# Patient Record
Sex: Male | Born: 1960 | Race: White | Hispanic: No | Marital: Married | Smoking: Former smoker
Health system: Southern US, Community
[De-identification: ages and names within clinical notes are randomized; demographics above are authoritative.]

## PROBLEM LIST (undated history)

## (undated) DIAGNOSIS — K259 Gastric ulcer, unspecified as acute or chronic, without hemorrhage or perforation: Secondary | ICD-10-CM

## (undated) DIAGNOSIS — K219 Gastro-esophageal reflux disease without esophagitis: Secondary | ICD-10-CM

## (undated) DIAGNOSIS — I639 Cerebral infarction, unspecified: Secondary | ICD-10-CM

## (undated) DIAGNOSIS — K859 Acute pancreatitis without necrosis or infection, unspecified: Secondary | ICD-10-CM

## (undated) DIAGNOSIS — I10 Essential (primary) hypertension: Secondary | ICD-10-CM

## (undated) DIAGNOSIS — Z87898 Personal history of other specified conditions: Secondary | ICD-10-CM

---

## 2006-09-18 ENCOUNTER — Emergency Department: Payer: Self-pay | Admitting: Emergency Medicine

## 2007-08-14 ENCOUNTER — Inpatient Hospital Stay: Payer: Self-pay | Admitting: Internal Medicine

## 2007-08-14 ENCOUNTER — Other Ambulatory Visit: Payer: Self-pay

## 2011-03-27 LAB — COMPREHENSIVE METABOLIC PANEL
Albumin: 4.1 g/dL (ref 3.4–5.0)
Alkaline Phosphatase: 58 U/L (ref 50–136)
BUN: 7 mg/dL (ref 7–18)
Bilirubin,Total: 0.3 mg/dL (ref 0.2–1.0)
Chloride: 106 mmol/L (ref 98–107)
Co2: 27 mmol/L (ref 21–32)
EGFR (Non-African Amer.): >60
Potassium: 3.6 mmol/L (ref 3.5–5.1)
SGPT (ALT): 18 U/L
Sodium: 143 mmol/L (ref 136–145)
Total Protein: 7.3 g/dL (ref 6.4–8.2)

## 2011-03-27 LAB — CBC
HCT: 42.4 % (ref 40.0–52.0)
HGB: 14.6 g/dL (ref 13.0–18.0)
MCV: 93 fL (ref 80–100)
RBC: 4.58 10*6/uL (ref 4.40–5.90)
WBC: 8.8 10*3/uL (ref 3.8–10.6)

## 2011-03-27 LAB — URINALYSIS, COMPLETE
Bacteria: NONE SEEN
Bilirubin,UR: NEGATIVE
Glucose,UR: NEGATIVE mg/dL (ref 0–75)
Ketone: NEGATIVE
RBC,UR: <1 /HPF (ref 0–5)
WBC UR: <1 /HPF (ref 0–5)

## 2011-03-27 LAB — TROPONIN I: Troponin-I: <0.02 ng/mL

## 2011-03-28 ENCOUNTER — Observation Stay: Payer: Self-pay | Admitting: Internal Medicine

## 2011-03-28 LAB — DRUG SCREEN, URINE
Amphetamines, Ur Screen: NEGATIVE (ref ?–1000)
Benzodiazepine, Ur Scrn: NEGATIVE (ref ?–200)
Cocaine Metabolite,Ur ~~LOC~~: NEGATIVE (ref ?–300)
MDMA (Ecstasy)Ur Screen: NEGATIVE (ref ?–500)
Methadone, Ur Screen: NEGATIVE (ref ?–300)
Opiate, Ur Screen: NEGATIVE (ref ?–300)
Phencyclidine (PCP) Ur S: NEGATIVE (ref ?–25)
Tricyclic, Ur Screen: NEGATIVE (ref ?–1000)

## 2011-03-28 LAB — CK TOTAL AND CKMB (NOT AT ARMC)
CK, Total: 78 U/L (ref 35–232)
CK-MB: 0.8 ng/mL (ref 0.5–3.6)

## 2011-03-28 LAB — TROPONIN I: Troponin-I: <0.02 ng/mL

## 2011-03-28 LAB — BASIC METABOLIC PANEL
Anion Gap: 10 (ref 7–16)
BUN: 8 mg/dL (ref 7–18)
Calcium, Total: 8.5 mg/dL (ref 8.5–10.1)
Chloride: 111 mmol/L — ABNORMAL HIGH (ref 98–107)
Creatinine: 0.65 mg/dL (ref 0.60–1.30)
EGFR (African American): >60
EGFR (Non-African Amer.): >60
Potassium: 3.8 mmol/L (ref 3.5–5.1)

## 2011-03-28 LAB — CBC WITH DIFFERENTIAL/PLATELET
Basophil %: 1.3 %
Eosinophil #: 0.4 10*3/uL (ref 0.0–0.7)
HCT: 39.8 % — ABNORMAL LOW (ref 40.0–52.0)
HGB: 13.6 g/dL (ref 13.0–18.0)
Lymphocyte %: 26.4 %
MCHC: 34.3 g/dL (ref 32.0–36.0)
MCV: 93 fL (ref 80–100)
Monocyte #: 0.5 10*3/uL (ref 0.0–0.7)
Monocyte %: 6.7 %
Neutrophil #: 4.7 10*3/uL (ref 1.4–6.5)
Neutrophil %: 60.5 %
RBC: 4.3 10*6/uL — ABNORMAL LOW (ref 4.40–5.90)

## 2011-03-28 LAB — ETHANOL: Ethanol %: <0.003 % (ref 0.000–0.080)

## 2011-03-28 LAB — LIPID PANEL
Cholesterol: 154 mg/dL (ref 0–200)
Cholesterol: 157 mg/dL (ref 0–200)
HDL Cholesterol: 45 mg/dL (ref 40–60)
Ldl Cholesterol, Calc: 81 mg/dL (ref 0–100)
Ldl Cholesterol, Calc: 99 mg/dL (ref 0–100)
Triglycerides: 136 mg/dL (ref 0–200)
VLDL Cholesterol, Calc: 13 mg/dL (ref 5–40)

## 2011-03-28 LAB — HEMOGLOBIN A1C: Hemoglobin A1C: 5.8 % (ref 4.2–6.3)

## 2011-03-29 LAB — BASIC METABOLIC PANEL
Calcium, Total: 8.6 mg/dL (ref 8.5–10.1)
Chloride: 112 mmol/L — ABNORMAL HIGH (ref 98–107)
Co2: 24 mmol/L (ref 21–32)
Creatinine: 0.81 mg/dL (ref 0.60–1.30)
Potassium: 3.8 mmol/L (ref 3.5–5.1)
Sodium: 145 mmol/L (ref 136–145)

## 2014-07-07 NOTE — Consult Note (Signed)
Brief Consult Note: Diagnosis: R/O alcohol dependence.   Patient was seen by consultant.   Consult note dictated.   Recommend further assessment or treatment.   Comments: Spoke with the patuient and his wife. He adamantly denies any problems with alcohol, just "2 cold beers at night" and declines substance abuse treatment program participation.  Electronic Signatures: Orson Slick (MD)  (Signed 14-Jan-13 19:17)  Authored: Brief Consult Note   Last Updated: 14-Jan-13 19:17 by Orson Slick (MD)

## 2014-07-07 NOTE — Consult Note (Signed)
PATIENT NAME:  Joel Campos, Joel Campos MR#:  [id number] DATE OF BIRTH:  02/25/1961  DATE OF CONSULTATION:  03/28/2011  REFERRING PHYSICIAN:  Dr. Posey Pronto  CONSULTING PHYSICIAN:  Isaias Cowman, MD  CHIEF COMPLAINT: Blurry vision.   HISTORY OF PRESENT ILLNESS: The patient is a 54 year old gentleman referred for evaluation of pulmonary edema. The patient apparently was in his usual state of health until 03/27/2011. He came home from work and developed blurry vision with slurred speech. The patient has a history of alcohol abuse. The patient had been abstinent for seven years and recently had been drinking approximately three beers every day. The patient presented to [facility] Emergency Room where he has ruled out for myocardial infarction by CPK isoenzymes. B-type Natriuretic Peptide was borderline elevated at 135. Chest x-ray revealed mild interstitial edema. The patient denies chest pain or shortness of breath.   PAST MEDICAL HISTORY: Alcoholism.   MEDICATIONS: None.   SOCIAL HISTORY: The patient is married. Has three children. He drinks three beers a day. He smokes 1 pack of cigarettes a day.   FAMILY HISTORY: No immediate family history of myocardial infarction or coronary artery disease.   REVIEW OF SYSTEMS: CONSTITUTIONAL: No fever or chills. EYES: The patient has blurry vision. EARS: The patient denies hearing loss. RESPIRATORY: The patient denies shortness of breath. CARDIOVASCULAR: The patient denies chest pain. GI: The patient denies nausea, vomiting, diarrhea, or constipation. GU: The patient denies dysuria or hematuria. NEUROLOGIC: The patient denies any focal muscle weakness or numbness. PSYCHOLOGICAL: The patient denies anxiety or depression.   PHYSICAL EXAMINATION:   VITAL SIGNS: Blood pressure 110/65, pulse 54, respirations 17, pulse oximetry 97%, temperature 98.7.   HEENT: The patient has an eye patch over his right eye.   LUNGS: Clear.   HEART: Normal JVP. Normal PMI. Regular rate  and rhythm. Normal S1, S2. No appreciable gallop, murmur, or rub.   ABDOMEN: Soft, nontender. Pulses were intact bilaterally.   MUSCULOSKELETAL: Normal muscle tone.   NEUROLOGIC: The patient is alert and oriented x3. Motor and sensory both grossly intact.   IMPRESSION: This is a 54 year old gentleman who presents with blurry vision and confusion without chest pain or shortness of breath. Chest x-ray revealed mild interstitial edema. B-type Natriuretic Peptide was only borderline elevated. The patient had no clinical signs of ischemia or congestive heart failure.  RECOMMENDATIONS:  1. Continue present medications.  2. Would defer full dose anticoagulation.  3. Defer diuresis in the absence of symptoms and clinical signs of CHF.  4. Review 2-D echocardiogram.  5. Further recommendations pending echocardiogram results.  ____________________________ Isaias Cowman, MD ap:drc D: 03/28/2011 09:39:15 ET T: 03/28/2011 11:02:27 ET JOB#: [id number]  cc: Isaias Cowman, MD, <Dictator> Isaias Cowman MD ELECTRONICALLY SIGNED 04/08/2011 12:37

## 2014-07-07 NOTE — Consult Note (Signed)
PATIENT NAME:  Joel Campos, Joel Campos MR#:  [id number] DATE OF BIRTH:  02-22-1961  DATE OF CONSULTATION:  03/29/2011  REFERRING PHYSICIAN:  Lenore Cordia, MD CONSULTING PHYSICIAN:  Jolanta B. Bary Leriche, MD  IDENTIFYING DATA: Joel Campos is a 54 year old male with history of alcohol use.   CHIEF COMPLAINT:  I have been sober.   HISTORY OF PRESENT ILLNESS: Joel Campos was admitted to [facility] medical floor for confusion. He reportedly suffered a stroke in the right lobe of the thalamus. This was initially associated with confusion, difficulties walking and problems with his vision. All symptoms resolved, but he still feels that his vision is slightly blurry. This worries him as he gainfully employed and feels that vision problems may affect his job performance. He was assured by his medical doctor that this should go away. The patient has a history of alcoholism per chart, but he and his wife adamantly deny any current alcohol use and are not interested in substance abuse treatment. The patient assures me that he has been working seven days a week and cannot afford to drink. He does have one or two cold beers after work, but feels that this is pretty normal. He denies symptoms of depression, anxiety, symptoms suggestive of bipolar mania or psychotic symptoms. He denies illicit substance or prescription pill abuse.   PAST PSYCHIATRIC HISTORY: He denies ever being hospitalized. No suicide attempts. No substance abuse treatments. Per chart, he does have a history of alcoholism. He had been clean for seven years and three years ago relapsed on alcohol.   FAMILY PSYCHIATRIC HISTORY: None reported.   PAST MEDICAL HISTORY:  1. Recent cerebrovascular accident.  2. Malignant hypertension.  3. Hypernatremia.  4. Tobacco abuse.   ALLERGIES: No known drug allergies.   MEDICATIONS ON ADMISSION:  1. Aspirin 325 mg daily.  2. Atorvastatin 40 mg daily.  3. Nicotine inhaler.   REVIEW OF  SYSTEMS: CONSTITUTIONAL: No fevers or chills. No weight changes. EYES: Positive for some blurred vision since his stroke. ENT: No hearing loss. RESPIRATORY: No shortness of breath or cough. CARDIOVASCULAR: No chest pain or orthopnea. GASTROINTESTINAL: No abdominal pain, nausea, vomiting, or diarrhea. History of gastrointestinal bleed. GU: No incontinence or frequency. ENDOCRINE: No heat or cold intolerance. LYMPHATIC: No anemia or easy bruising. INTEGUMENTARY: No acne or rash. MUSCULOSKELETAL: No muscle or joint pain. NEUROLOGIC: No tingling or weakness. PSYCHIATRIC: See history of present illness for details.   PHYSICAL EXAMINATION:  VITAL SIGNS: Blood pressure 127/69, pulse 76, respirations 20, temperature 97.5.   GENERAL: This is a well-developed male in no acute distress. The rest of the physical examination is deferred to his primary provider. Normal muscle strength. No stiffness or cogwheeling. No tremor.   LABORATORY DATA: Chemistries within normal limits except for blood glucose of 122. Blood alcohol level on admission 0. LFTs within normal limits. Urine tox screen negative for substances. CBC within normal limits. Urinalysis is not suggestive of urinary tract infection. EKG: Normal sinus rhythm, possible left atrial enlargement, incomplete right bundle branch block.   MENTAL STATUS EXAMINATION: The patient is alert and oriented to person, place, time, and situation. He is sitting comfortably in bed having lunch that his wife brought for him, enjoying good appetite. The wife is having breathing treatment in his room. The patient is pleasant, polite, and cooperative. He maintains good eye contact. He is well groomed and casually dressed, ready to go home. His speech is of normal rhythm, rate, and volume. Mood is good with  full affect. Thought processing is logical and goal oriented. Thought content: He denies suicidal or homicidal ideation. There are no delusions or paranoia. There are no auditory or  visual hallucinations. His cognition is grossly intact. His insight and judgment are fair. His wife who is present during the interview confirms his story. They have been married for a long time. She does not see any problems with the extent of his drinking.   SUICIDE RISK ASSESSMENT: This is a patient with history of alcoholism who relapsed on alcohol three years ago after a seven year period of sobriety who at bedtime been consuming two beers each night and who declines substance abuse treatment.   DIAGNOSIS: AXIS I: Alcohol dependence.   AXIS II: Deferred.   AXIS III:  1. Recent cerebrovascular accident.  2. Hypertension.   AXIS IV: Substance abuse, physical illness.   AXIS V: GAF 45.   PLAN: The patient adamantly denies any problems with alcohol and is not willing to participate in substance abuse treatment. The wife concurs. I will sign off.    ____________________________ Wardell Honour. Bary Leriche, MD jbp:ap D: 03/29/2011 19:38:28 ET T: 03/30/2011 10:52:41 ET JOB#: [id number]  cc: Jolanta B. Bary Leriche, MD, <Dictator> Joel Fredrickson MD ELECTRONICALLY SIGNED 04/06/2011 23:32

## 2014-07-07 NOTE — Consult Note (Signed)
Brief Consult Note: Diagnosis: ? CHF, mild edema on CXR, borderline elevated BNP, no clinical evidence of CHF, neg trop.   Patient was seen by consultant.   Consult note dictated.   Comments: REC  Agree with current therpay, defer diuresis, defer full dose anticoagulation, review echo, further recommendations pending echo results.  Electronic Signatures: Isaias Cowman (MD)  (Signed 13-Jan-13 09:41)  Authored: Brief Consult Note   Last Updated: 13-Jan-13 09:41 by Isaias Cowman (MD)

## 2014-07-07 NOTE — H&P (Signed)
PATIENT NAME:  Joel Campos, Joel Campos MR#:  [id number] DATE OF BIRTH:  [birth year]  DATE OF ADMISSION:  03/27/2011  PRIMARY CARE PHYSICIAN: None.  EMERGENCY ROOM PHYSICIAN: Dr. Renee Ramus  CHIEF COMPLAINT: Confusion.   HISTORY OF PRESENT ILLNESS: The patient is a 54 year old male who presents with the chief complaint of confusion. Symptoms began about 4 o'clock p.m. this evening. The patient came home from work and appeared to be not himself. His speech was somewhat slurred. He could not make any eye contact. The patient has history of alcohol abuse. He reports that he drinks about three beers every day. He had stayed abstinent for about 7 years. For the last 3 years he has had a relapse and has been drinking alcohol. Last evening around 3:30 he had one beer.  He reports having blurred vision. The patient has a history of gastrointestinal bleeding and underwent EGD in June 2009 which showed gastric ulcer duodenitis.   PAST MEDICAL HISTORY: Alcoholism.   ALLERGIES: No known drug allergies.   SOCIAL HISTORY: The patient smokes one pack per day. He has drank three beers a day for the last three years. He has history of alcoholism and had quit drinking alcohol seven years ago. For the last three years he has had a relapse. He denies any IV drug abuse. He is employed in a [employer], chart[employer].   FAMILY HISTORY: The patient's father died in his 29s and had lung cancer. Mother is 25 years old and has a pacemaker and heart disease.   REVIEW OF SYSTEMS: CONSTITUTIONAL: The patient denies any fevers, chills, or night sweats. HEENT: The patient denies any hearing loss, dysphagia, visual problems, or sore throat. CARDIOVASCULAR: The patient denies any chest pain, orthopnea, or PND. RESPIRATORY: The patient denies any cough, wheezing, or hemoptysis. GI: The patient denies any abdominal pain, hematemesis, hematochezia, or melena. GU: The patient denies any hematuria, dysuria, or frequency. NEUROLOGIC: The patient denies any  headache, focal weakness, or seizures. SKIN: The patient denies any lesions or rash. ENDOCRINE: The patient denies polyuria, polyphagia, or polydipsia. MUSCULOSKELETAL: The patient denies any arthritis, joint effusion, or swelling. HEMATOLOGICAL: The patient denies any easy bleeding or bruises.   PHYSICAL EXAMINATION:   VITAL SIGNS: Temperature 97.2, heart rate 90, respiratory rate 18, blood pressure 175/97, and oxygen saturation 98%.   HEENT: Atraumatic, normocephalic. Pupils are equal, round, and reactive to light and accommodation. Extraocular movements intact. Sclerae anicteric. Mucous membranes dry.   NECK: Supple. No organomegaly.   HEART: S1 and S2 regular rate and rhythm. No gallops or rubs. No thrills. No murmurs.   LUNGS: Clear to auscultation. No rales, no rhonchi, no wheezes, and no bronchial breath sounds.   GI: Abdomen is soft, nontender, and nondistended. Normal bowel sounds. No hepatosplenomegaly.   GU: There is no hematuria or masses noted.   SKIN: No lesions and no rash. No ecchymosis, no bleeding, and no petechiae noted.   LYMPH: No lymphadenopathy or nodes palpable.   NEUROLOGIC: Cranial nerves II through XII grossly intact. Motor strength is 5 out of 5 in bilateral upper and lower extremities. Sensation is within normal limits. No focal neurological deficits are noted on examination.   MUSCULOSKELETAL: No arthritis, joint effusion, or swelling.   EXTREMITIES: No cyanosis, no clubbing, and no edema. 2+ pedal pulses are noted bilaterally.   LABS/STUDIES: EKG: Normal sinus rhythm at 94 beats per minute, incomplete right bundle branch block.  CT scan of the brain: No evidence of acute intracranial abnormalities.   Chest  x-ray shows there is mild hyperinflation and pulmonary venous hypertension with the findings suggesting mild interstitial edema.   Glucose 101, BUN 7, creatinine 0.65, sodium 143, potassium 3.6, chloride 96, CO2 27, calcium 8.8, total bilirubin 0.3,  alkaline phosphatase 58, ALT 18, AST 26, total protein 7.3, and albumin 4.1. EGFR greater than 60. WBC count 8000, hemoglobin 14.6, hematocrit 42.4, and platelet count 269. Troponin is less than 0.02.   Urinalysis is negative.   ASSESSMENT AND PLAN:  1. The patient is a 54 year old male with history of alcoholism who presents with the chief complaint of confusion, blurred vision, and hypertension, alcohol withdrawal syndrome. Admit to Critical Care Unit. Start protocol CIWA protocol with Ativan, thiamine, folate acid, and clonidine for hypertension. Start IV fluids. Substance abuse counseling. Start IV Protonix.  2. Mild interstitial pulmonary edema. Etiology is unclear. We will get a CT scan of the chest. We will check serial cardiac enzymes and troponin, and check BNP.  ____________________________ Tyrone Schimke, MD jsp:slb D: 03/27/2011 23:27:36 ET T: 03/28/2011 08:10:47 ET JOB#: [id number]  cc: Tyrone Schimke, MD, <Dictator> Tyrone Schimke MD ELECTRONICALLY SIGNED 03/28/2011 22:08

## 2014-07-07 NOTE — H&P (Signed)
PATIENT NAME:  Joel Campos, Joel Campos MR#:  [id number] DATE OF BIRTH:  [birth year]  DATE OF ADMISSION:  03/27/2011  ADDENDUM  ASSESSMENT AND PLAN:  1. Altered mental status: We will check carotid Doppler and MRI of the brain. 2. Pulmonary edema: Findings on chest x-ray. The patient is not having any symptoms. We will get an Echo and Cardiology consultation.  ____________________________ Tyrone Schimke, MD jsp:slb D: 03/28/2011 00:26:43 ET T: 03/28/2011 08:46:25 ET JOB#: [id number]  cc: Tyrone Schimke, MD, <Dictator> Tyrone Schimke MD ELECTRONICALLY SIGNED 03/28/2011 22:08

## 2014-07-07 NOTE — Discharge Summary (Signed)
PATIENT NAME:  Joel Campos, Joel Campos MR#:  [id number] DATE OF BIRTH:  [birth year]  DATE OF ADMISSION:  03/27/2011 DATE OF DISCHARGE:  03/29/2011  ADMITTING DIAGNOSIS: Stroke.  DISCHARGE DIAGNOSES: 1. 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  2. Malignant hypertension.  3. Alcohol, tobacco abuse. 4. Hyponatremia, resolved with intravenous fluids.  5. Hyperglycemia with A1c 5.8.   DISCHARGE CONDITION: Fair.   DISCHARGE MEDICATIONS: Patient does not have any home medications. Patient is to start new medications which are:  1. Aspirin 325 mg p.o. daily.  2. Lipitor 40 mg p.o. at bedtime.  3. Nicotine oral inhaler one cartridge every one hour as needed.   DIET: 2 grams salt, low fat, low cholesterol.   PHYSICAL ACTIVITY LIMITATIONS: As tolerated.   DISCHARGE INSTRUCTIONS: The patient was advised to patch his eyes alternating daily; one day he would patch one eye, other day he would patch other eye. Patient was advised not to smoke as well as not to drink any alcohol.   FOLLOW UP: He is to follow up and establish primary care physician at [facility] in two days after discharge. He is also to follow up with ophthalmologist in next few days after discharge. He is also to follow up with Dr. Lovie Macadamia on 04/03/2011 at 10:00 a.m.   CONSULTANTS:  1. Care management.  2. Dr. Saralyn Pilar.   LABORATORY, DIAGNOSTIC AND RADIOLOGICAL DATA: Chest x-ray one view 03/27/2011: Pulmonary venous hypertension with findings suggesting mild interstitial edema, mild hyperinflation. CT of head without contrast 03/26/2010: No acute intracranial process. CT of chest for pulmonary embolism with contrast 03/28/2011 showed no pulmonary embolus identified. Indeterminate 4 mm pulmonary nodules. A follow-up noncontrast chest CT in twelve months is recommended. MRI of brain without contrast 03/28/2011 showed acute nonhemorrhagic infarct in right lobe of thalamus. MRI of  cervical spine without contrast 03/28/2011 showed spinal cord which is normal in caliber and signal, however, no significant bulges or neuroforaminal narrowing was noted. Echocardiogram 03/28/2011: Normal left ventricular function, left ventricular hypertrophy, mild to moderate MR, mild TR. Carotid ultrasound Doppler 03/28/2011 showed no evidence of the hemodynamically significant stenosis in extracranial carotid arteries.   Patient's EKG showed normal sinus rhythm at 94 beats per minute, normal axis, incomplete right bundle branch block was noted, nonspecific ST-T changes. Patient's lab data 03/27/2011: Glucose elevated at 101, otherwise unremarkable BMP. Patient's liver enzymes were normal. Cardiac enzymes, first set, as well as subsequent one set was unremarkable. Patient's urine drug screen was also unremarkable. CBC was within normal limits. Urinalysis was unremarkable. ABGs were done on 21% FiO2 showed pH 7.43, pCO2 42, pO2 99, saturation 97.9%. Chest x-ray was unremarkable as well as his CT scan of head.   HISTORY OF PRESENT ILLNESS: Patient is a 54 year old Caucasian male with history of tobacco, alcohol abuse, not on any medications at home who presented to the hospital with complaints of sudden onset of double vision as well as unsteadiness on his feet, bumping into furniture. Please refer to Dr. Serita Grit admission note on 03/27/2011. On arrival to the hospital patient's temperature was 97.2, heart rate was 90, respiration rate was 18, blood pressure 175/97, saturation was 98% on room air. Physical examination was unremarkable.   HOSPITAL COURSE: Patient presented to the hospital with concerns about possible alcohol withdrawal due to his confusion as well as staggering gait, however, it appeared that patient has also double vision. Stroke workup was entertained. Patient was started on aspirin therapy and continued thereof. He  had MRI of his brain done which revealed stroke in right thalamus area. He  also had carotid ultrasound which was unremarkable for abnormalities. It was felt that patient's thalamic stroke could have been thrombotic, however, echocardiogram done on 03/28/2011 failed to show any significant abnormalities. It was felt that patient is to continue aspirin therapy as well as Lipitor. Patient's lipid panel was performed while he was in the hospital and LDL was found to be 81 to 99 and total cholesterol was found to be 154 to 157, triglycerides 136 on nonfasting specimen and 66 on fasting specimen, and HDL was found to be 46 to 45. It was felt that patient is to continue Lipitor. He is to have low fat, low cholesterol diet and follow up with his primary care physician to make sure that patient's lipid panel, LDL remains below 100, preferably below 70. Patient was evaluated by speech therapist as well as physical therapist who did not feel that he needs any further follow up. He improved significantly in regards to his gait, however, intermittently required some help. He is to get cane if he needs to from [place] for ambulation. He is also to follow up with ophthalmologist for help in regards to his double vision management. For now he was recommended to continue patching his eye intermittently, one eye or the other eye to help him with double vision.   In regards to malignant hypertension, patient's blood pressure was noted to be significantly elevated initially on arrival to the hospital, however, when patient's acute stroke resolved patient's blood pressure was found to be normal around 765 to 465K systolic. As patient's blood pressure remained somewhat on the lower side, 120s to 130s, it was felt that it would be concerning to start blood pressure medications in the hospital because of concern of lowering his blood pressure significantly and worsening his ischemia, however, if patient's blood pressure readings remain high as outpatient patient should be started on blood pressure medications  if needed.   In regards to alcohol abuse, patient was counseled extensively about alcohol use, however, he was placed on CIWA scale and did not show any withdrawal symptoms.   In regards to tobacco abuse, he was again counseled. He was advised not to smoke because of risks of worsening stroke. He understood the warnings and agreed to continue nicotine replacement therapy.   Patient was noted to be hyperglycemic in the hospital. A few tests were done early in the morning and patient's blood glucose levels remained high. It was unclear, however, it was fasting testing or not, however, patient's elevated blood glucose levels warranted further evaluation so hemoglobin A1c was performed. Hemoglobin A1c was found to be not elevated at 5.8, however, it is recommended to follow patient's blood glucose levels and get formal glucose tolerance test as outpatient if needed.   Patient is being discharged in stable condition with above-mentioned medications and follow up. His vital signs on the day of discharge: Temperature 98.0, pulse 68, respiration rate 20, blood pressure systolic range of 354S to 568, diastolic ranging from 68 to 83, oxygen saturation 97% on room air at rest.     TIME SPENT: 40 minutes.  ____________________________ Theodoro Grist, MD rv:cms D: 03/29/2011 18:00:32 ET T: 03/31/2011 06:58:31 ET JOB#: [id number]  cc: Theodoro Grist, MD, <Dictator> Berneice Heinrich, MD [place] MD ELECTRONICALLY SIGNED 04/12/2011 7:48

## 2016-11-08 ENCOUNTER — Encounter: Payer: Self-pay | Admitting: Medical Oncology

## 2016-11-08 ENCOUNTER — Emergency Department
Admission: EM | Admit: 2016-11-08 | Discharge: 2016-11-08 | Disposition: A | Discharge status: Home / Self Care | Payer: BLUE CROSS/BLUE SHIELD | Attending: Emergency Medicine | Admitting: Emergency Medicine

## 2016-11-08 ENCOUNTER — Emergency Department: Payer: BLUE CROSS/BLUE SHIELD

## 2016-11-08 DIAGNOSIS — R112 Nausea with vomiting, unspecified: Secondary | ICD-10-CM | POA: Insufficient documentation

## 2016-11-08 LAB — CBC
HCT: 50.5 % (ref 40.0–52.0)
Hemoglobin: 17.6 g/dL (ref 13.0–18.0)
MCH: 31.6 pg (ref 26.0–34.0)
MCHC: 34.7 g/dL (ref 32.0–36.0)
MCV: 91 fL (ref 80.0–100.0)
PLATELETS: 286 10*3/uL (ref 150–440)
RBC: 5.55 MIL/uL (ref 4.40–5.90)
RDW: 13.3 % (ref 11.5–14.5)
WBC: 10.8 10*3/uL — ABNORMAL HIGH (ref 3.8–10.6)

## 2016-11-08 LAB — COMPREHENSIVE METABOLIC PANEL
ALBUMIN: 4.9 g/dL (ref 3.5–5.0)
ALT: 13 U/L — ABNORMAL LOW (ref 17–63)
ANION GAP: 11 (ref 5–15)
AST: 19 U/L (ref 15–41)
Alkaline Phosphatase: 78 U/L (ref 38–126)
BILIRUBIN TOTAL: 1.4 mg/dL — AB (ref 0.3–1.2)
BUN: 23 mg/dL — ABNORMAL HIGH (ref 6–20)
CHLORIDE: 108 mmol/L (ref 101–111)
CO2: 26 mmol/L (ref 22–32)
Calcium: 10.1 mg/dL (ref 8.9–10.3)
Creatinine, Ser: 0.79 mg/dL (ref 0.61–1.24)
GFR calc Af Amer: >60 mL/min (ref >60)
GFR calc non Af Amer: >60 mL/min (ref >60)
GLUCOSE: 97 mg/dL (ref 65–99)
POTASSIUM: 3.9 mmol/L (ref 3.5–5.1)
SODIUM: 145 mmol/L (ref 135–145)
TOTAL PROTEIN: 8.1 g/dL (ref 6.5–8.1)

## 2016-11-08 LAB — TROPONIN I

## 2016-11-08 LAB — LIPASE, BLOOD: Lipase: 22 U/L (ref 11–51)

## 2016-11-08 MED ORDER — ONDANSETRON 4 MG PO TBDP: 4.0000 mg | ORAL_TABLET | Freq: Three times a day (TID) | ORAL | 0 refills | Status: AC | PRN

## 2016-11-08 MED ORDER — PANTOPRAZOLE SODIUM 40 MG PO TBEC
40.0000 mg | DELAYED_RELEASE_TABLET | Freq: Every day | ORAL | 1 refills | Status: AC
Start: 2016-11-08 — End: 2019-02-21

## 2016-11-08 MED ORDER — GI COCKTAIL ~~LOC~~
30.0000 mL | Freq: Once | ORAL | Status: AC
  Administered 2016-11-08: 30 mL via ORAL
  Filled 2016-11-08: qty 30

## 2016-11-08 NOTE — ED Notes (Signed)
Pt states he is unable to void at this time.

## 2016-11-08 NOTE — ED Triage Notes (Signed)
Pt reports that he began Saturday having nausea and vomiting clear emesis. Pt drinking coke on way to triage room, advised to stay NPO at this time. Pt reports burning pain to throat from vomiting. Pt in NAD at this time.

## 2016-11-08 NOTE — ED Notes (Addendum)
Pt reports that he has had vomiting/diarrhea since Saturday - vomited x5+ in 24 hours - diarrhea x2 in 24 hours - pt states that he is unable to keep anything down - denies any other symptoms - pt states he took phenergan but was unable to keep it down

## 2016-11-08 NOTE — ED Notes (Signed)
Patient transported to X-ray 

## 2016-11-08 NOTE — ED Provider Notes (Signed)
[facility] Emergency Department Provider Note  Time seen: 1:40 PM  I have reviewed the triage vital signs and the nursing notes.   HISTORY  Chief Complaint Emesis    HPI Joel Campos is a 56 y.o. male with no past medical history who presents to the emergency department for nausea with episodes of vomiting. According to the patient for the past 3 days he has been intermittently nauseated and occasionally vomiting clear frothy emesis. Patient states he has not been able to keep down any food for the past 3 days. Patient also is describing a burning sensation from his upper abdomen into his throat which she relates to gastric reflux. States he was prescribed Nexium at one point but does not take it. Patient denies any shortness of breath. Denies any pain in his abdomen, denies diarrhea or dysuria. Daughter states the patient has lost approximate 5 pounds as he has not been eating over the past several days. Patient does admit to frequent alcohol use but has not taken alcohol over the past 3 days. Currently the patient appears well, calm, no distress  History reviewed. No pertinent past medical history.  There are no active problems to display for this patient.   History reviewed. No pertinent surgical history.  Prior to Admission medications   Not on File    No Known Allergies  No family history on file.  Social History Social History  Substance Use Topics  . Smoking status: Not on file  . Smokeless tobacco: Not on file  . Alcohol use Not on file    Review of Systems Constitutional: Negative for fever Cardiovascular: Burning sensation in his chest/throat, which the patient believes is reflux Respiratory: Negative for shortness of breath. Gastrointestinal: Negative for abdominal pain. Positive for intermittent vomiting. Negative for diarrhea Genitourinary: Negative for dysuria Neurological: Negative for headache All other ROS  negative  ____________________________________________   PHYSICAL EXAM:  VITAL SIGNS: ED Triage Vitals [11/08/16 0928]  Enc Vitals Group     BP (!) 147/93     Pulse Rate 63     Resp 16     Temp 98.9 F (37.2 C)     Temp Source Oral     SpO2 99 %     Weight 131 lb (59.4 kg)     Height 5\' 7"  (1.702 m)     Head Circumference      Peak Flow      Pain Score 3     Pain Loc      Pain Edu?      Excl. in [place]?     Constitutional: Alert and oriented. Well appearing and in no distress. Eyes: Normal exam ENT   Head: Normocephalic and atraumatic.   Mouth/Throat: Mucous membranes are moist. Cardiovascular: Normal rate, regular rhythm. No murmur Respiratory: Normal respiratory effort without tachypnea nor retractions. Breath sounds are clear Gastrointestinal: Soft and nontender. No distention.   Musculoskeletal: Nontender with normal range of motion in all extremities. No lower extremity tenderness or edema. Neurologic:  Normal speech and language. No gross focal neurologic deficits  Skin:  Skin is warm, dry and intact.  Psychiatric: Mood and affect are normal.   ____________________________________________    EKG  EKG reviewed and interpreted by myself shows sinus rhythm at 64 bpm, narrow QRS, normal axis, normal intervals, nonspecific ST changes but no ST elevation.  ____________________________________________    RADIOLOGY  X-ray negative  ____________________________________________   INITIAL IMPRESSION / ASSESSMENT AND PLAN / ED  COURSE  Pertinent labs & imaging results that were available during my care of the patient were reviewed by me and considered in my medical decision making (see chart for details).  Patient presents to the emergency department for intermittent nausea and vomiting over the past 3 days. Overall the patient appears very well, nontender abdomen. Patient's symptoms are somewhat suggestive of esophagitis or gastritis. Patient does admit to  near daily alcohol use which would predispose him to gastritis. We will treat with a GI cocktail. Patient's basic labs are within normal limits including LFTs and lipase. I have added on a troponin as a precaution we will obtain a chest x-ray. After GI cocktail will attempt oral hydration. Patient agreeable to plan.  Chest x-ray is negative, labs are normal including cardiac enzymes. LFTs and lipase are normal as well. Patient states he is feeling much better after GI cocktail. We will attempt to orally hydrate. If the patient can drink and continues to feel well we will discharge home with GI follow-up and Protonix. Patient is agreeable to this plan. He states he has a GI doctor through [facility] that he wishes to see.  ____________________________________________   FINAL CLINICAL IMPRESSION(S) / ED DIAGNOSES  Nausea vomiting    Harvest Dark, MD 11/08/16 [phone]

## 2016-11-08 NOTE — ED Notes (Signed)
Pt tolerated PO challenge without vomiting - he stated that he felt the need to "spit" but was no longer nauseated - pt has not vomited since being brought to the back of the ER for treatment

## 2016-11-08 NOTE — ED Notes (Signed)
Pt given water for PO challenge per Dr Kerman Passey

## 2017-08-25 ENCOUNTER — Encounter: Payer: Self-pay | Admitting: *Deleted

## 2017-08-26 ENCOUNTER — Encounter
Admission: RE | Disposition: A | Discharge status: Home / Self Care | Payer: Self-pay | Source: Ambulatory Visit | Attending: Unknown Physician Specialty

## 2017-08-26 ENCOUNTER — Ambulatory Visit: Payer: BLUE CROSS/BLUE SHIELD | Admitting: Anesthesiology

## 2017-08-26 ENCOUNTER — Ambulatory Visit
Admission: RE | Admit: 2017-08-26 | Discharge: 2017-08-26 | Disposition: A | Discharge status: Home / Self Care | Payer: BLUE CROSS/BLUE SHIELD | Source: Ambulatory Visit | Attending: Unknown Physician Specialty | Admitting: Unknown Physician Specialty

## 2017-08-26 ENCOUNTER — Encounter: Payer: Self-pay | Admitting: *Deleted

## 2017-08-26 DIAGNOSIS — Z1211 Encounter for screening for malignant neoplasm of colon: Secondary | ICD-10-CM | POA: Diagnosis present

## 2017-08-26 DIAGNOSIS — Z8719 Personal history of other diseases of the digestive system: Secondary | ICD-10-CM | POA: Diagnosis not present

## 2017-08-26 DIAGNOSIS — B9681 Helicobacter pylori [H. pylori] as the cause of diseases classified elsewhere: Secondary | ICD-10-CM | POA: Diagnosis not present

## 2017-08-26 DIAGNOSIS — Z87891 Personal history of nicotine dependence: Secondary | ICD-10-CM | POA: Insufficient documentation

## 2017-08-26 DIAGNOSIS — R918 Other nonspecific abnormal finding of lung field: Secondary | ICD-10-CM | POA: Diagnosis not present

## 2017-08-26 DIAGNOSIS — K2950 Unspecified chronic gastritis without bleeding: Secondary | ICD-10-CM | POA: Insufficient documentation

## 2017-08-26 DIAGNOSIS — K621 Rectal polyp: Secondary | ICD-10-CM | POA: Insufficient documentation

## 2017-08-26 DIAGNOSIS — K219 Gastro-esophageal reflux disease without esophagitis: Secondary | ICD-10-CM | POA: Diagnosis not present

## 2017-08-26 DIAGNOSIS — Z8673 Personal history of transient ischemic attack (TIA), and cerebral infarction without residual deficits: Secondary | ICD-10-CM | POA: Insufficient documentation

## 2017-08-26 DIAGNOSIS — Z7982 Long term (current) use of aspirin: Secondary | ICD-10-CM | POA: Insufficient documentation

## 2017-08-26 DIAGNOSIS — I10 Essential (primary) hypertension: Secondary | ICD-10-CM | POA: Diagnosis not present

## 2017-08-26 DIAGNOSIS — K635 Polyp of colon: Secondary | ICD-10-CM | POA: Insufficient documentation

## 2017-08-26 DIAGNOSIS — K640 First degree hemorrhoids: Secondary | ICD-10-CM | POA: Insufficient documentation

## 2017-08-26 DIAGNOSIS — D125 Benign neoplasm of sigmoid colon: Secondary | ICD-10-CM | POA: Diagnosis not present

## 2017-08-26 DIAGNOSIS — D123 Benign neoplasm of transverse colon: Secondary | ICD-10-CM | POA: Diagnosis not present

## 2017-08-26 DIAGNOSIS — Z79899 Other long term (current) drug therapy: Secondary | ICD-10-CM | POA: Insufficient documentation

## 2017-08-26 HISTORY — DX: Personal history of other specified conditions: Z87.898

## 2017-08-26 HISTORY — DX: Essential (primary) hypertension: I10

## 2017-08-26 HISTORY — DX: Cerebral infarction, unspecified: I63.9

## 2017-08-26 HISTORY — PX: COLONOSCOPY WITH PROPOFOL: SHX5780

## 2017-08-26 HISTORY — DX: Acute pancreatitis without necrosis or infection, unspecified: K85.90

## 2017-08-26 HISTORY — DX: Gastric ulcer, unspecified as acute or chronic, without hemorrhage or perforation: K25.9

## 2017-08-26 HISTORY — PX: ESOPHAGOGASTRODUODENOSCOPY (EGD) WITH PROPOFOL: SHX5813

## 2017-08-26 HISTORY — DX: Gastro-esophageal reflux disease without esophagitis: K21.9

## 2017-08-26 SURGERY — ESOPHAGOGASTRODUODENOSCOPY (EGD) WITH PROPOFOL
Anesthesia: General

## 2017-08-26 MED ORDER — PROPOFOL 500 MG/50ML IV EMUL
INTRAVENOUS | Status: DC | PRN
  Administered 2017-08-26: 50 ug/kg/min via INTRAVENOUS

## 2017-08-26 MED ORDER — GLYCOPYRROLATE 0.2 MG/ML IJ SOLN
INTRAMUSCULAR | Status: DC | PRN
  Administered 2017-08-26: 0.2 mg via INTRAVENOUS

## 2017-08-26 MED ORDER — LIDOCAINE HCL (PF) 2 % IJ SOLN
INTRAMUSCULAR | Status: AC
  Filled 2017-08-26: qty 10

## 2017-08-26 MED ORDER — SODIUM CHLORIDE 0.9 % IV SOLN
INTRAVENOUS | Status: DC
  Administered 2017-08-26 (×2): via INTRAVENOUS

## 2017-08-26 MED ORDER — MIDAZOLAM HCL 2 MG/2ML IJ SOLN
INTRAMUSCULAR | Status: AC
  Filled 2017-08-26: qty 2

## 2017-08-26 MED ORDER — PHENYLEPHRINE HCL 10 MG/ML IJ SOLN
INTRAMUSCULAR | Status: DC | PRN
  Administered 2017-08-26 (×5): 100 ug via INTRAVENOUS

## 2017-08-26 MED ORDER — LIDOCAINE HCL (PF) 2 % IJ SOLN
INTRAMUSCULAR | Status: DC | PRN
  Administered 2017-08-26: 70 mg

## 2017-08-26 MED ORDER — PROPOFOL 500 MG/50ML IV EMUL
INTRAVENOUS | Status: AC
  Filled 2017-08-26: qty 50

## 2017-08-26 MED ORDER — GLYCOPYRROLATE 0.2 MG/ML IJ SOLN
INTRAMUSCULAR | Status: AC
  Filled 2017-08-26: qty 1

## 2017-08-26 MED ORDER — FENTANYL CITRATE (PF) 100 MCG/2ML IJ SOLN
INTRAMUSCULAR | Status: DC | PRN
  Administered 2017-08-26 (×2): 50 ug via INTRAVENOUS

## 2017-08-26 MED ORDER — MIDAZOLAM HCL 5 MG/5ML IJ SOLN
INTRAMUSCULAR | Status: DC | PRN
  Administered 2017-08-26 (×2): 2 mg via INTRAVENOUS

## 2017-08-26 MED ORDER — PROPOFOL 10 MG/ML IV BOLUS
INTRAVENOUS | Status: DC | PRN
  Administered 2017-08-26: 20 mg via INTRAVENOUS
  Administered 2017-08-26: 30 mg via INTRAVENOUS

## 2017-08-26 MED ORDER — SODIUM CHLORIDE 0.9 % IV SOLN: INTRAVENOUS | Status: DC

## 2017-08-26 MED ORDER — FENTANYL CITRATE (PF) 100 MCG/2ML IJ SOLN
INTRAMUSCULAR | Status: AC
Start: 2017-08-26 — End: 2017-08-26
  Filled 2017-08-26: qty 2

## 2017-08-26 NOTE — Op Note (Signed)
[facility] Gastroenterology Patient Name: Joel Campos Procedure Date: 08/26/2017 1:34 PM MRN: [id number] Account #: [id number] Date of Birth: August 31, 1960 Admit Type: Outpatient Age: 57 Room: [facility] ENDO ROOM 3 Gender: Male Note Status: Finalized Procedure:            Upper GI endoscopy Indications:          follow up ulcer of stomach. Providers:            Manya Silvas, MD Referring MD:         Rusty Aus, MD (Referring MD) Medicines:            Propofol per Anesthesia Complications:        No immediate complications. Procedure:            Pre-Anesthesia Assessment:                       - After reviewing the risks and benefits, the patient                        was deemed in satisfactory condition to undergo the                        procedure.                       After obtaining informed consent, the endoscope was                        passed under direct vision. Throughout the procedure,                        the patient's blood pressure, pulse, and oxygen                        saturations were monitored continuously. The Endoscope                        was introduced through the mouth, and advanced to the                        second part of duodenum. The upper GI endoscopy was                        accomplished without difficulty. The patient tolerated                        the procedure well. Findings:      The examined esophagus was normal.      Diffuse mild inflammation characterized by erythema and granularity was       found in the gastric body. Biopsies were taken with a cold forceps for       histology. Biopsies were taken with a cold forceps for Helicobacter       pylori testing.      The examined duodenum was normal. Impression:           - Normal esophagus.                       - Gastritis. Biopsied.                       -  Normal examined duodenum. Recommendation:       - Await pathology results. Manya Silvas,  MD 08/26/2017 1:45:54 PM This report has been signed electronically. Number of Addenda: 0 Note Initiated On: 08/26/2017 1:34 PM      [facility]

## 2017-08-26 NOTE — Op Note (Signed)
[facility] Gastroenterology Patient Name: Joel Campos Procedure Date: 08/26/2017 1:33 PM MRN: [id number] Account #: [id number] Date of Birth: 05/03/1960 Admit Type: Outpatient Age: 57 Room: [facility] ENDO ROOM 3 Gender: Male Note Status: Finalized Procedure:            Colonoscopy Indications:          Screening for colorectal malignant neoplasm Providers:            Manya Silvas, MD Referring MD:         Rusty Aus, MD (Referring MD) Medicines:            Propofol per Anesthesia Complications:        No immediate complications. Procedure:            Pre-Anesthesia Assessment:                       - After reviewing the risks and benefits, the patient                        was deemed in satisfactory condition to undergo the                        procedure.                       After obtaining informed consent, the colonoscope was                        passed under direct vision. Throughout the procedure,                        the patient's blood pressure, pulse, and oxygen                        saturations were monitored continuously. The                        Colonoscope was introduced through the anus and                        advanced to the the cecum, identified by appendiceal                        orifice and ileocecal valve. The colonoscopy was                        performed without difficulty. The patient tolerated the                        procedure well. The quality of the bowel preparation                        was excellent. Findings:      Two sessile polyps were found in the transverse colon. The polyps were       diminutive in size. These polyps were removed with a cold snare.       Resection and retrieval were complete.      A small polyp was found in the sigmoid colon. The polyp was sessile. The       polyp was removed with a hot snare. Resection and retrieval were  complete.      Seven sessile polyps were found in the  rectum and recto-sigmoid colon.       The polyps were diminutive in size. These polyps were removed with a hot       snare. Resection was complete, and retrieval was complete.      Internal hemorrhoids were found during endoscopy. The hemorrhoids were       small and Grade I (internal hemorrhoids that do not prolapse).      The exam was otherwise without abnormality. Impression:           - Two diminutive polyps in the transverse colon,                        removed with a cold snare. Resected and retrieved.                       - One small polyp in the sigmoid colon, removed with a                        hot snare. Resected and retrieved.                       - Seven diminutive polyps in the rectum and at the                        recto-sigmoid colon, removed with a hot snare. Resected                        and retrieved.                       - Internal hemorrhoids.                       - The examination was otherwise normal. Recommendation:       - Await pathology results. Manya Silvas, MD 08/26/2017 2:20:15 PM This report has been signed electronically. Number of Addenda: 0 Note Initiated On: 08/26/2017 1:33 PM Scope Withdrawal Time: 0 hours 20 minutes 53 seconds  Total Procedure Duration: 0 hours 25 minutes 52 seconds       [facility]

## 2017-08-26 NOTE — Anesthesia Post-op Follow-up Note (Signed)
Anesthesia QCDR form completed.        

## 2017-08-26 NOTE — Transfer of Care (Signed)
Immediate Anesthesia Transfer of Care Note  Patient: Joel Campos  Procedure(s) Performed: ESOPHAGOGASTRODUODENOSCOPY (EGD) WITH PROPOFOL (N/A ) COLONOSCOPY WITH PROPOFOL (N/A )  Patient Location: PACU  Anesthesia Type:General  Level of Consciousness: sedated  Airway & Oxygen Therapy: Patient Spontanous Breathing and Patient connected to nasal cannula oxygen  Post-op Assessment: Report given to RN and Post -op Vital signs reviewed and stable  Post vital signs: Reviewed and stable  Last Vitals:  Vitals Value Taken Time  BP 84/69 08/26/2017  2:21 PM  Temp 36.1 C 08/26/2017  2:20 PM  Pulse 82 08/26/2017  2:20 PM  Resp 16 08/26/2017  2:20 PM  SpO2 100 % 08/26/2017  2:20 PM  Vitals shown include unvalidated device data.  Last Pain:  Vitals:   08/26/17 1420  TempSrc: Tympanic  PainSc:          Complications: No apparent anesthesia complications

## 2017-08-26 NOTE — H&P (Signed)
Primary Care Physician:  Rusty Aus, MD Primary Gastroenterologist:  Dr. Vira Agar  Pre-Procedure History & Physical: HPI:  Joel Campos is a 57 y.o. male is here for an endoscopy and colonoscopy.  Colon cancer screening and follow up gastric ulcer.   Past Medical History:  Diagnosis Date  . CVA (cerebral vascular accident) ([place])   . GERD (gastroesophageal reflux disease)   . History of multiple pulmonary nodules   . Hypertension   . Multiple gastric ulcers   . Pancreatitis     History reviewed. No pertinent surgical history.  Prior to Admission medications   Medication Sig Start Date End Date Taking? Authorizing Provider  aspirin EC 81 MG tablet Take 81 mg by mouth daily.   Yes [provider]  lisinopril-hydrochlorothiazide (PRINZIDE,ZESTORETIC) 20-12.5 MG tablet Take 1 tablet by mouth daily.   Yes [provider]  ondansetron (ZOFRAN ODT) 4 MG disintegrating tablet Take 1 tablet (4 mg total) by mouth every 8 (eight) hours as needed for nausea or vomiting. 11/08/16  Yes Harvest Dark, MD  pantoprazole (PROTONIX) 40 MG tablet Take 1 tablet (40 mg total) by mouth daily. 11/08/16 11/08/17 Yes Harvest Dark, MD    Allergies as of 07/07/2017  . (No Known Allergies)    History reviewed. No pertinent family history.  Social History   Socioeconomic History  . Marital status: Married    Spouse name: Not on file  . Number of children: Not on file  . Years of education: Not on file  . Highest education level: Not on file  Occupational History  . Not on file  Social Needs  . Financial resource strain: Not on file  . Food insecurity:    Worry: Not on file    Inability: Not on file  . Transportation needs:    Medical: Not on file    Non-medical: Not on file  Tobacco Use  . Smoking status: Former Research scientist (life sciences)  . Smokeless tobacco: Never Used  Substance and Sexual Activity  . Alcohol use: Yes  . Drug use: Never  . Sexual activity: Not on file   Lifestyle  . Physical activity:    Days per week: Not on file    Minutes per session: Not on file  . Stress: Not on file  Relationships  . Social connections:    Talks on phone: Not on file    Gets together: Not on file    Attends religious service: Not on file    Active member of club or organization: Not on file    Attends meetings of clubs or organizations: Not on file    Relationship status: Not on file  . Intimate partner violence:    Fear of current or ex partner: Not on file    Emotionally abused: Not on file    Physically abused: Not on file    Forced sexual activity: Not on file  Other Topics Concern  . Not on file  Social History Narrative  . Not on file    Review of Systems: See HPI, otherwise negative ROS  Physical Exam: BP 124/83   Pulse 91   Temp (!) 97.4 F (36.3 C) (Tympanic)   Resp 16   Ht 5\' 7"  (1.702 m)   Wt 71.2 kg (157 lb)   SpO2 98%   BMI 24.59 kg/m  General:   Alert,  pleasant and cooperative in NAD Head:  Normocephalic and atraumatic. Neck:  Supple; no masses or thyromegaly. Lungs:  Clear throughout to  auscultation.    Heart:  Regular rate and rhythm. Abdomen:  Soft, nontender and nondistended. Normal bowel sounds, without guarding, and without rebound.   Neurologic:  Alert and  oriented x4;  grossly normal neurologically.  Impression/Plan: Joel Campos is here for an endoscopy and colonoscopy to be performed for colon cancer screening and follow up gastric ulcer  Risks, benefits, limitations, and alternatives regarding  endoscopy and colonoscopy have been reviewed with the patient.  Questions have been answered.  All parties agreeable.   Gaylyn Cheers, MD  08/26/2017, 1:17 PM

## 2017-08-26 NOTE — Anesthesia Preprocedure Evaluation (Signed)
Anesthesia Evaluation  Patient identified by MRN, date of birth, ID band Patient awake    Reviewed: Allergy & Precautions, H&P , NPO status , Patient's Chart, lab work & pertinent test results, reviewed documented beta blocker date and time   Airway Mallampati: II   Neck ROM: full    Dental  (+) Poor Dentition, Teeth Intact   Pulmonary neg pulmonary ROS, former smoker,    Pulmonary exam normal        Cardiovascular Exercise Tolerance: Good hypertension, On Medications negative cardio ROS Normal cardiovascular exam Rhythm:regular Rate:Normal     Neuro/Psych CVA, No Residual Symptoms negative neurological ROS  negative psych ROS   GI/Hepatic negative GI ROS, Neg liver ROS, PUD, GERD  ,  Endo/Other  negative endocrine ROS  Renal/GU negative Renal ROS  negative genitourinary   Musculoskeletal   Abdominal   Peds  Hematology negative hematology ROS (+)   Anesthesia Other Findings Past Medical History: No date: CVA (cerebral vascular accident) ([place]) No date: GERD (gastroesophageal reflux disease) No date: History of multiple pulmonary nodules No date: Hypertension No date: Multiple gastric ulcers No date: Pancreatitis History reviewed. No pertinent surgical history. BMI    Body Mass Index:  24.59 kg/m     Reproductive/Obstetrics negative OB ROS                             Anesthesia Physical Anesthesia Plan  ASA: III  Anesthesia Plan: General   Post-op Pain Management:    Induction:   PONV Risk Score and Plan:   Airway Management Planned:   Additional Equipment:   Intra-op Plan:   Post-operative Plan:   Informed Consent: I have reviewed the patients History and Physical, chart, labs and discussed the procedure including the risks, benefits and alternatives for the proposed anesthesia with the patient or authorized representative who has indicated his/her understanding and  acceptance.   Dental Advisory Given  Plan Discussed with: CRNA  Anesthesia Plan Comments:         Anesthesia Quick Evaluation

## 2017-08-30 LAB — SURGICAL PATHOLOGY

## 2017-08-31 ENCOUNTER — Encounter: Payer: Self-pay | Admitting: Unknown Physician Specialty

## 2017-09-01 NOTE — Anesthesia Postprocedure Evaluation (Signed)
Anesthesia Post Note  Patient: Joel Campos  Procedure(s) Performed: ESOPHAGOGASTRODUODENOSCOPY (EGD) WITH PROPOFOL (N/A ) COLONOSCOPY WITH PROPOFOL (N/A )  Patient location during evaluation: PACU Anesthesia Type: General Level of consciousness: awake and alert Pain management: pain level controlled Vital Signs Assessment: post-procedure vital signs reviewed and stable Respiratory status: spontaneous breathing, nonlabored ventilation, respiratory function stable and patient connected to nasal cannula oxygen Cardiovascular status: blood pressure returned to baseline and stable Postop Assessment: no apparent nausea or vomiting Anesthetic complications: no     Last Vitals:  Vitals:   08/26/17 1420 08/26/17 1450  BP:  128/72  Pulse:    Resp: 19   Temp: (!) 36.1 C   SpO2: 100%     Last Pain:  Vitals:   08/26/17 1450  TempSrc:   PainSc: 0-No pain                 Molli Barrows

## 2019-02-13 IMAGING — CR DG CHEST 2V
2 series · 2 of 2 positions shown · non-contrast
Comparison: 03/27/2011 chest radiograph.

CLINICAL DATA: Chest pain

EXAM:
CHEST  2 VIEW

[chest pa]
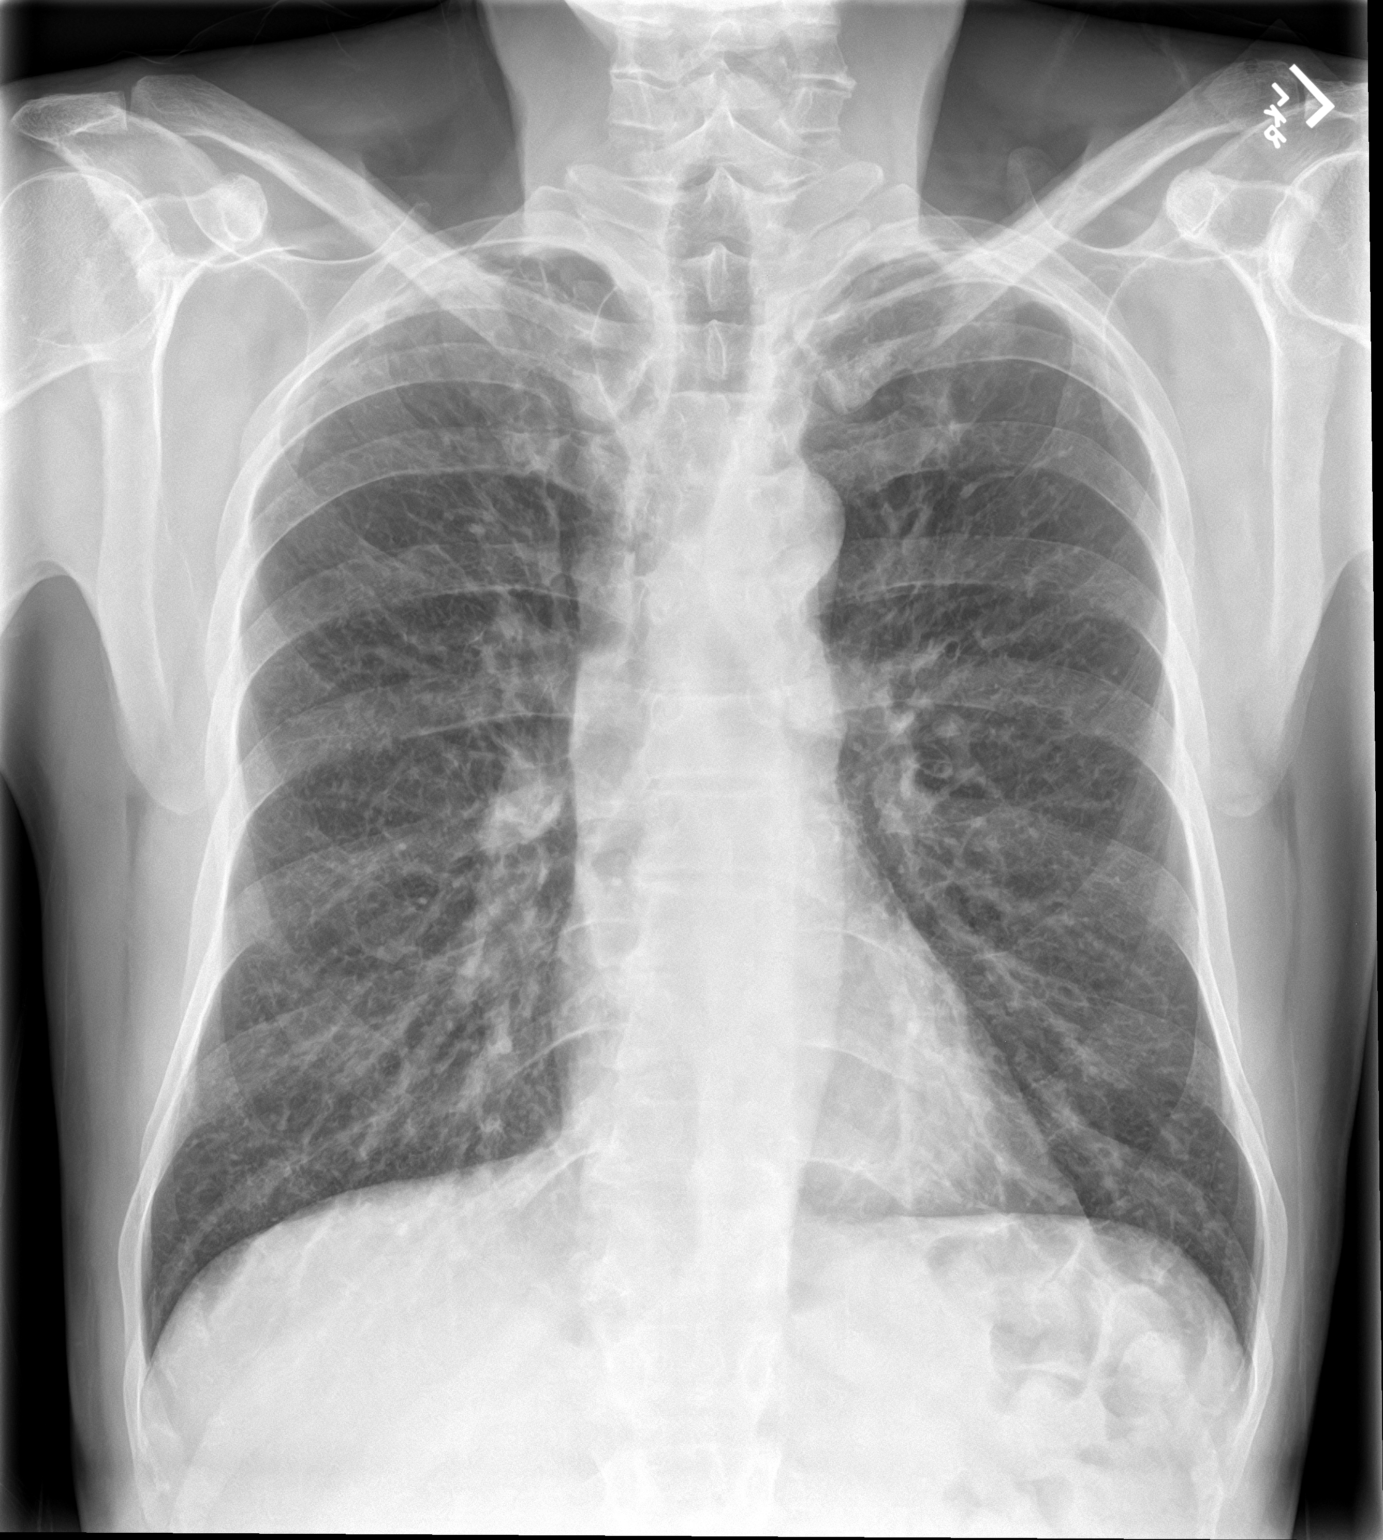

[chest lat]
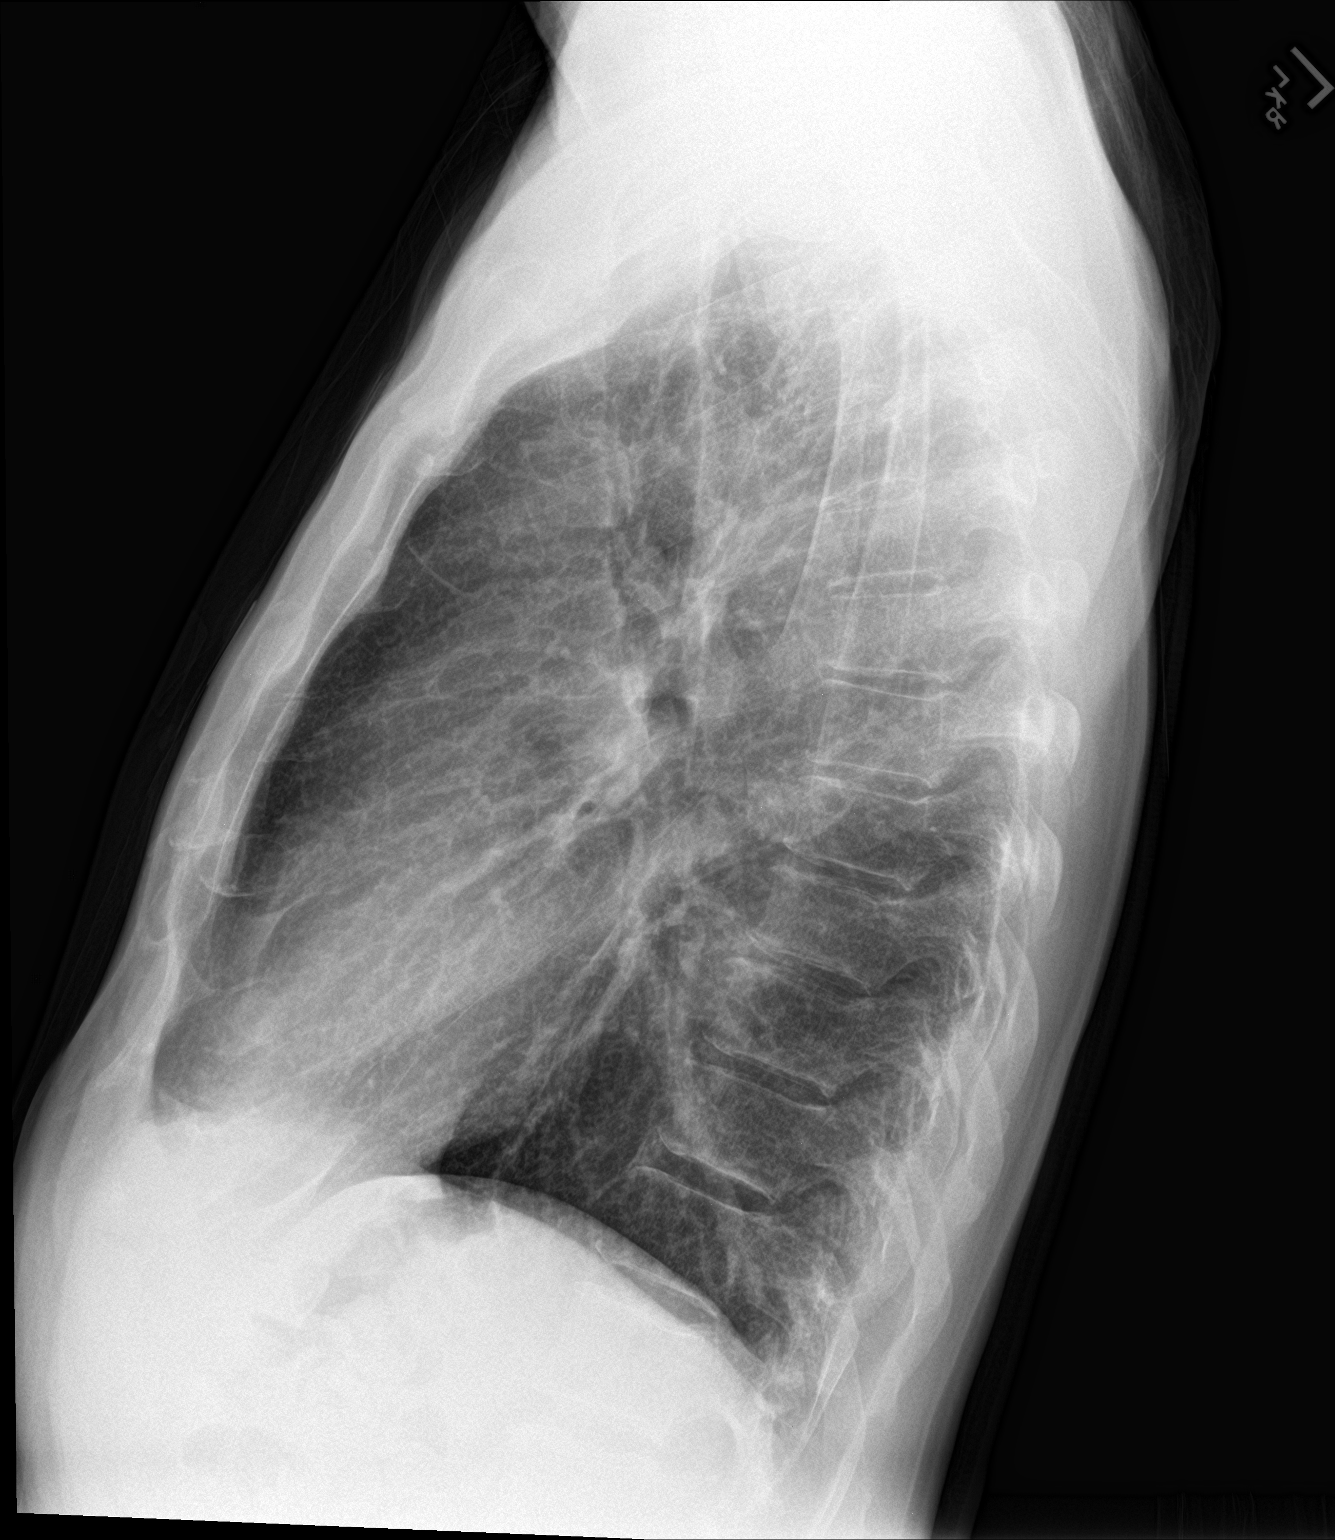

[2 of 2 positions shown; findings below may reference images not displayed]

FINDINGS: Stable cardiomediastinal silhouette with normal heart size. No
pneumothorax. No pleural effusion. Lungs appear clear, with no acute
consolidative airspace disease and no pulmonary edema.
IMPRESSION: No active cardiopulmonary disease.

## 2019-02-14 ENCOUNTER — Other Ambulatory Visit: Payer: Self-pay | Admitting: Surgery

## 2019-02-16 ENCOUNTER — Other Ambulatory Visit: Payer: Self-pay

## 2019-02-16 ENCOUNTER — Encounter
Admission: RE | Admit: 2019-02-16 | Discharge: 2019-02-16 | Disposition: A | Discharge status: Home / Self Care | Payer: BC Managed Care – PPO | Source: Ambulatory Visit | Attending: Surgery | Admitting: Surgery

## 2019-02-16 DIAGNOSIS — Z20828 Contact with and (suspected) exposure to other viral communicable diseases: Secondary | ICD-10-CM | POA: Diagnosis not present

## 2019-02-16 DIAGNOSIS — R9431 Abnormal electrocardiogram [ECG] [EKG]: Secondary | ICD-10-CM | POA: Insufficient documentation

## 2019-02-16 DIAGNOSIS — M720 Palmar fascial fibromatosis [Dupuytren]: Secondary | ICD-10-CM | POA: Diagnosis not present

## 2019-02-16 DIAGNOSIS — I10 Essential (primary) hypertension: Secondary | ICD-10-CM | POA: Diagnosis not present

## 2019-02-16 DIAGNOSIS — Z01812 Encounter for preprocedural laboratory examination: Secondary | ICD-10-CM | POA: Diagnosis not present

## 2019-02-16 LAB — BASIC METABOLIC PANEL
Anion gap: 10 (ref 5–15)
BUN: 14 mg/dL (ref 6–20)
CO2: 24 mmol/L (ref 22–32)
Calcium: 9 mg/dL (ref 8.9–10.3)
Chloride: 108 mmol/L (ref 98–111)
Creatinine, Ser: 0.83 mg/dL (ref 0.61–1.24)
GFR calc Af Amer: >60 mL/min (ref >60)
GFR calc non Af Amer: >60 mL/min (ref >60)
Glucose, Bld: 82 mg/dL (ref 70–99)
Potassium: 3.5 mmol/L (ref 3.5–5.1)
Sodium: 142 mmol/L (ref 135–145)

## 2019-02-16 LAB — CBC
HCT: 53.4 % — ABNORMAL HIGH (ref 39.0–52.0)
Hemoglobin: 18.2 g/dL — ABNORMAL HIGH (ref 13.0–17.0)
MCH: 30.6 pg (ref 26.0–34.0)
MCHC: 34.1 g/dL (ref 30.0–36.0)
MCV: 89.9 fL (ref 80.0–100.0)
Platelets: 286 10*3/uL (ref 150–400)
RBC: 5.94 MIL/uL — ABNORMAL HIGH (ref 4.22–5.81)
RDW: 12.1 % (ref 11.5–15.5)
WBC: 7.7 10*3/uL (ref 4.0–10.5)
nRBC: 0 % (ref 0.0–0.2)

## 2019-02-16 NOTE — Pre-Procedure Instructions (Signed)
SECURE CHAT WITH DR Ola Spurr:  Pt having Dupuytrens Contracture release on 02-21-19. Pt has htn. Ekg done today was abnormal. Can you compare it with Ekg done in 2018 and let me know if pt needs clearance? Thanks

## 2019-02-16 NOTE — Patient Instructions (Addendum)
Your procedure is scheduled on: 02-21-19 [facility] Report to Same Day Surgery 2nd floor medical mall [facility] Entrance-take elevator on left to 2nd floor.  Check in with surgery information desk.) To find out your arrival time please call [phone] between 1PM - 3PM on 02-20-19 TUESDAY  Remember: Instructions that are not followed completely may result in serious medical risk, up to and including death, or upon the discretion of your surgeon and anesthesiologist your surgery may need to be rescheduled.    _x___ 1. Do not eat food after midnight the night before your procedure. NO GUM OR CANDY AFTER MIDNIGHT. You may drink clear liquids up to 2 hours before you are scheduled to arrive at the hospital for your procedure.  Do not drink clear liquids within 2 hours of your scheduled arrival to the hospital.  Clear liquids include  --Water or Apple juice without pulp  --Gatorade  --Black Coffee or Clear Tea (No milk, no creamers, do not add anything to the coffee or Tea   ____Ensure clear carbohydrate drink on the way to the hospital for bariatric patients  _X___Ensure clear carbohydrate drink 3 hours PRIOR TO ARRIVAL TIME TO HOSPITAL     __x__ 2. No Alcohol for 24 hours before or after surgery.   __x__3. No Smoking or e-cigarettes for 24 prior to surgery.  Do not use any chewable tobacco products for at least 6 hour prior to surgery   ____  4. Bring all medications with you on the day of surgery if instructed.    __x__ 5. Notify your doctor if there is any change in your medical condition     (cold, fever, infections).    x___6. On the morning of surgery brush your teeth with toothpaste and water.  You may rinse your mouth with mouth wash if you wish.  Do not swallow any toothpaste or mouthwash.   Do not wear jewelry, make-up, hairpins, clips or nail polish.  Do not wear lotions, powders, or perfumes.  Do not shave 48 hours prior to surgery. Men may shave face and neck.  Do not  bring valuables to the hospital.    [facility] is not responsible for any belongings or valuables.               Contacts, dentures or bridgework may not be worn into surgery.  Leave your suitcase in the car. After surgery it may be brought to your room.  For patients admitted to the hospital, discharge time is determined by your treatment team.  _  Patients discharged the day of surgery will not be allowed to drive home.  You will need someone to drive you home and stay with you the night of your procedure.    Please read over the following fact sheets that you were given:   [facility] Preparing for Surgery  _x___ TAKE THE FOLLOWING MEDICATION THE MORNING OF SURGERY WITH A SMALL SIP OF WATER. These include:  1. VENLAFAXINE (EFFEXOR)  2. PANTOPRAZOLE (PROTONIX)  3. TAKE AN EXTRA PANTOPRAZOLE THE NIGHT BEFORE YOUR SURGERY  4.  5.  6.  ____Fleets enema or Magnesium Citrate as directed.   _x___ Use CHG Soap or sage wipes as directed on instruction sheet   ____ Use inhalers on the day of surgery and bring to hospital day of surgery  ____ Stop Metformin and Janumet 2 days prior to surgery.    ____ Take 1/2 of usual insulin dose the night before surgery and none on  the morning surgery.   _x___ Follow recommendations from Cardiologist, Pulmonologist or PCP regarding stopping Aspirin, Coumadin, Plavix ,Eliquis, Effient, or Pradaxa, and Pletal-ASK DR POGGI ABOUT YOUR ASPIRIN [id number])  X____Stop Anti-inflammatories such as Advil, Aleve, Ibuprofen, Motrin, Naproxen,MELOXICAM (MOBIC) Naprosyn, Goodies powders or aspirin products NOW-OK to take Tylenol    ____ Stop supplements until after surgery.   ____ Bring C-Pap to the hospital.

## 2019-02-16 NOTE — Pre-Procedure Instructions (Signed)
CALLEDTIFFANY AT DR POGGIS OFFICE AND HAD TO LEAVE VM THAT PT IS NEEDING MEDICAL CLEARANCE. FAXED THIS TO DR Coates (PCP) AND INFORMED Joel Campos THAT SHE NEEDS TO F/U WITH CLEARANCE ON MONDAY

## 2019-02-16 NOTE — Pre-Procedure Instructions (Signed)
SECURE CHAT WITH DR Ola Spurr:  yes he does have new T wave inversion on his EKG so he should get medical clearance from his PCP (unless he has a cardiologist in which case you can send it to them), thanks!

## 2019-02-17 LAB — SARS CORONAVIRUS 2 (TAT 6-24 HRS): SARS Coronavirus 2: NEGATIVE

## 2019-02-20 NOTE — Pre-Procedure Instructions (Signed)
Appointment with Dr Loleta Chance @ [phone] for medical clearance.

## 2019-02-21 ENCOUNTER — Ambulatory Visit: Payer: BC Managed Care – PPO | Admitting: Anesthesiology

## 2019-02-21 ENCOUNTER — Other Ambulatory Visit: Payer: Self-pay

## 2019-02-21 ENCOUNTER — Encounter
Admission: RE | Disposition: A | Discharge status: Home / Self Care | Payer: Self-pay | Source: Home / Self Care | Attending: Surgery

## 2019-02-21 ENCOUNTER — Ambulatory Visit
Admission: RE | Admit: 2019-02-21 | Discharge: 2019-02-21 | Disposition: A | Discharge status: Home / Self Care | Payer: BC Managed Care – PPO | Attending: Surgery | Admitting: Surgery

## 2019-02-21 DIAGNOSIS — Z8249 Family history of ischemic heart disease and other diseases of the circulatory system: Secondary | ICD-10-CM | POA: Insufficient documentation

## 2019-02-21 DIAGNOSIS — Z79899 Other long term (current) drug therapy: Secondary | ICD-10-CM | POA: Diagnosis not present

## 2019-02-21 DIAGNOSIS — I10 Essential (primary) hypertension: Secondary | ICD-10-CM | POA: Insufficient documentation

## 2019-02-21 DIAGNOSIS — K219 Gastro-esophageal reflux disease without esophagitis: Secondary | ICD-10-CM | POA: Diagnosis not present

## 2019-02-21 DIAGNOSIS — M720 Palmar fascial fibromatosis [Dupuytren]: Secondary | ICD-10-CM | POA: Insufficient documentation

## 2019-02-21 DIAGNOSIS — Z818 Family history of other mental and behavioral disorders: Secondary | ICD-10-CM | POA: Diagnosis not present

## 2019-02-21 DIAGNOSIS — R918 Other nonspecific abnormal finding of lung field: Secondary | ICD-10-CM | POA: Insufficient documentation

## 2019-02-21 DIAGNOSIS — Z825 Family history of asthma and other chronic lower respiratory diseases: Secondary | ICD-10-CM | POA: Insufficient documentation

## 2019-02-21 DIAGNOSIS — K279 Peptic ulcer, site unspecified, unspecified as acute or chronic, without hemorrhage or perforation: Secondary | ICD-10-CM | POA: Insufficient documentation

## 2019-02-21 DIAGNOSIS — Z8349 Family history of other endocrine, nutritional and metabolic diseases: Secondary | ICD-10-CM | POA: Diagnosis not present

## 2019-02-21 DIAGNOSIS — Z87891 Personal history of nicotine dependence: Secondary | ICD-10-CM | POA: Insufficient documentation

## 2019-02-21 DIAGNOSIS — Z86010 Personal history of colonic polyps: Secondary | ICD-10-CM | POA: Insufficient documentation

## 2019-02-21 DIAGNOSIS — Z8673 Personal history of transient ischemic attack (TIA), and cerebral infarction without residual deficits: Secondary | ICD-10-CM | POA: Diagnosis not present

## 2019-02-21 HISTORY — PX: DUPUYTREN CONTRACTURE RELEASE: SHX1478

## 2019-02-21 SURGERY — RELEASE, DUPUYTREN CONTRACTURE
Anesthesia: General | Site: Little Finger | Laterality: Right

## 2019-02-21 MED ORDER — CEFAZOLIN SODIUM-DEXTROSE 2-4 GM/100ML-% IV SOLN
INTRAVENOUS | Status: AC
  Filled 2019-02-21: qty 100

## 2019-02-21 MED ORDER — METOCLOPRAMIDE HCL 10 MG PO TABS: 5.0000 mg | ORAL_TABLET | Freq: Three times a day (TID) | ORAL | Status: DC | PRN

## 2019-02-21 MED ORDER — MIDAZOLAM HCL 5 MG/5ML IJ SOLN
INTRAMUSCULAR | Status: DC | PRN
  Administered 2019-02-21: 2 mg via INTRAVENOUS

## 2019-02-21 MED ORDER — HYDROCODONE-ACETAMINOPHEN 5-325 MG PO TABS: 1.0000 | ORAL_TABLET | Freq: Four times a day (QID) | ORAL | 0 refills | Status: AC | PRN

## 2019-02-21 MED ORDER — OXYCODONE HCL 5 MG PO TABS
ORAL_TABLET | ORAL | Status: AC
  Administered 2019-02-21: 11:00:00 5 mg via ORAL
  Filled 2019-02-21: qty 1

## 2019-02-21 MED ORDER — DEXAMETHASONE SODIUM PHOSPHATE 10 MG/ML IJ SOLN
INTRAMUSCULAR | Status: DC | PRN
  Administered 2019-02-21: 10 mg via INTRAVENOUS

## 2019-02-21 MED ORDER — LIDOCAINE HCL (PF) 2 % IJ SOLN
INTRAMUSCULAR | Status: AC
  Filled 2019-02-21: qty 10

## 2019-02-21 MED ORDER — FENTANYL CITRATE (PF) 100 MCG/2ML IJ SOLN
INTRAMUSCULAR | Status: AC
  Administered 2019-02-21: 25 ug via INTRAVENOUS
  Filled 2019-02-21: qty 2

## 2019-02-21 MED ORDER — BUPIVACAINE HCL (PF) 0.5 % IJ SOLN
INTRAMUSCULAR | Status: DC | PRN
  Administered 2019-02-21: 10 mL

## 2019-02-21 MED ORDER — ONDANSETRON HCL 4 MG/2ML IJ SOLN: 4.0000 mg | Freq: Four times a day (QID) | INTRAMUSCULAR | Status: DC | PRN

## 2019-02-21 MED ORDER — LACTATED RINGERS IV SOLN
INTRAVENOUS | Status: DC
  Administered 2019-02-21: 09:00:00 via INTRAVENOUS

## 2019-02-21 MED ORDER — OXYCODONE HCL 5 MG/5ML PO SOLN: 5.0000 mg | Freq: Once | ORAL | Status: AC | PRN

## 2019-02-21 MED ORDER — FENTANYL CITRATE (PF) 100 MCG/2ML IJ SOLN
25.0000 ug | INTRAMUSCULAR | Status: DC | PRN
  Administered 2019-02-21 (×3): 25 ug via INTRAVENOUS

## 2019-02-21 MED ORDER — MIDAZOLAM HCL 2 MG/2ML IJ SOLN
INTRAMUSCULAR | Status: AC
  Filled 2019-02-21: qty 2

## 2019-02-21 MED ORDER — FENTANYL CITRATE (PF) 100 MCG/2ML IJ SOLN
INTRAMUSCULAR | Status: AC
  Filled 2019-02-21: qty 2

## 2019-02-21 MED ORDER — PROMETHAZINE HCL 25 MG/ML IJ SOLN: 6.2500 mg | INTRAMUSCULAR | Status: DC | PRN

## 2019-02-21 MED ORDER — POTASSIUM CHLORIDE IN NACL 20-0.9 MEQ/L-% IV SOLN
INTRAVENOUS | Status: DC
  Filled 2019-02-21: qty 1000

## 2019-02-21 MED ORDER — FENTANYL CITRATE (PF) 100 MCG/2ML IJ SOLN
INTRAMUSCULAR | Status: DC | PRN
  Administered 2019-02-21 (×2): 25 ug via INTRAVENOUS
  Administered 2019-02-21 (×2): 50 ug via INTRAVENOUS

## 2019-02-21 MED ORDER — METOCLOPRAMIDE HCL 5 MG/ML IJ SOLN: 5.0000 mg | Freq: Three times a day (TID) | INTRAMUSCULAR | Status: DC | PRN

## 2019-02-21 MED ORDER — ONDANSETRON HCL 4 MG/2ML IJ SOLN
INTRAMUSCULAR | Status: DC | PRN
  Administered 2019-02-21: 4 mg via INTRAVENOUS

## 2019-02-21 MED ORDER — HYDROCODONE-ACETAMINOPHEN 5-325 MG PO TABS
1.0000 | ORAL_TABLET | ORAL | Status: DC | PRN
  Administered 2019-02-21: 1 via ORAL

## 2019-02-21 MED ORDER — BUPIVACAINE HCL (PF) 0.5 % IJ SOLN
INTRAMUSCULAR | Status: AC
  Filled 2019-02-21: qty 30

## 2019-02-21 MED ORDER — ONDANSETRON HCL 4 MG PO TABS: 4.0000 mg | ORAL_TABLET | Freq: Four times a day (QID) | ORAL | Status: DC | PRN

## 2019-02-21 MED ORDER — HYDROCODONE-ACETAMINOPHEN 5-325 MG PO TABS
ORAL_TABLET | ORAL | Status: AC
  Filled 2019-02-21: qty 1

## 2019-02-21 MED ORDER — LIDOCAINE HCL (CARDIAC) PF 100 MG/5ML IV SOSY
PREFILLED_SYRINGE | INTRAVENOUS | Status: DC | PRN
  Administered 2019-02-21: 80 mg via INTRAVENOUS

## 2019-02-21 MED ORDER — OXYCODONE HCL 5 MG PO TABS: 5.0000 mg | ORAL_TABLET | Freq: Once | ORAL | Status: AC | PRN

## 2019-02-21 MED ORDER — CHLORHEXIDINE GLUCONATE 4 % EX LIQD
60.0000 mL | Freq: Once | CUTANEOUS | Status: AC
  Administered 2019-02-21: 4 via TOPICAL

## 2019-02-21 MED ORDER — PROPOFOL 10 MG/ML IV BOLUS
INTRAVENOUS | Status: AC
  Filled 2019-02-21: qty 20

## 2019-02-21 MED ORDER — MEPERIDINE HCL 50 MG/ML IJ SOLN: 6.2500 mg | INTRAMUSCULAR | Status: DC | PRN

## 2019-02-21 MED ORDER — FENTANYL CITRATE (PF) 100 MCG/2ML IJ SOLN
INTRAMUSCULAR | Status: AC
  Administered 2019-02-21: 50 ug via INTRAVENOUS
  Filled 2019-02-21: qty 2

## 2019-02-21 MED ORDER — PROPOFOL 10 MG/ML IV BOLUS
INTRAVENOUS | Status: DC | PRN
  Administered 2019-02-21: 30 mg via INTRAVENOUS
  Administered 2019-02-21: 150 mg via INTRAVENOUS

## 2019-02-21 MED ORDER — DEXAMETHASONE SODIUM PHOSPHATE 10 MG/ML IJ SOLN
INTRAMUSCULAR | Status: AC
  Filled 2019-02-21: qty 1

## 2019-02-21 MED ORDER — CEFAZOLIN SODIUM-DEXTROSE 2-4 GM/100ML-% IV SOLN
2.0000 g | INTRAVENOUS | Status: AC
  Administered 2019-02-21: 2 g via INTRAVENOUS

## 2019-02-21 MED ORDER — ONDANSETRON HCL 4 MG/2ML IJ SOLN
INTRAMUSCULAR | Status: AC
  Filled 2019-02-21: qty 2

## 2019-02-21 SURGICAL SUPPLY — 39 items
BLADE SURG 15 STRL LF DISP TIS (BLADE) ×1 IMPLANT
BLADE SURG 15 STRL SS (BLADE) ×2
BNDG CONFORM 2 STRL LF (GAUZE/BANDAGES/DRESSINGS) IMPLANT
BNDG ELASTIC 2X5.8 VLCR STR LF (GAUZE/BANDAGES/DRESSINGS) ×3 IMPLANT
BNDG ELASTIC 3X5.8 VLCR STR LF (GAUZE/BANDAGES/DRESSINGS) ×3 IMPLANT
BNDG ELASTIC 6X5.8 VLCR STR LF (GAUZE/BANDAGES/DRESSINGS) ×3 IMPLANT
BNDG ESMARK 4X12 TAN STRL LF (GAUZE/BANDAGES/DRESSINGS) ×3 IMPLANT
CAST PADDING 3X4FT ST 30246 (SOFTGOODS) ×2
CHLORAPREP W/TINT 26 (MISCELLANEOUS) ×3 IMPLANT
CORD BIP STRL DISP 12FT (MISCELLANEOUS) ×3 IMPLANT
COVER WAND RF STERILE (DRAPES) ×3 IMPLANT
CUFF TOURN SGL QUICK 18X4 (TOURNIQUET CUFF) ×3 IMPLANT
DRAPE SPLIT 6X30 W/TAPE (DRAPES) ×3 IMPLANT
DRAPE SURG 17X11 SM STRL (DRAPES) ×3 IMPLANT
ELECT REM PT RETURN 9FT ADLT (ELECTROSURGICAL) ×3
ELECTRODE REM PT RTRN 9FT ADLT (ELECTROSURGICAL) ×1 IMPLANT
FORCEPS JEWEL BIP 4-3/4 STR (INSTRUMENTS) ×3 IMPLANT
GAUZE SPONGE 4X4 12PLY STRL (GAUZE/BANDAGES/DRESSINGS) ×3 IMPLANT
GAUZE XEROFORM 1X8 LF (GAUZE/BANDAGES/DRESSINGS) ×3 IMPLANT
GLOVE BIO SURGEON STRL SZ8 (GLOVE) ×6 IMPLANT
GLOVE INDICATOR 8.0 STRL GRN (GLOVE) ×3 IMPLANT
GOWN STRL REUS W/ TWL LRG LVL3 (GOWN DISPOSABLE) ×1 IMPLANT
GOWN STRL REUS W/ TWL XL LVL3 (GOWN DISPOSABLE) ×1 IMPLANT
GOWN STRL REUS W/TWL LRG LVL3 (GOWN DISPOSABLE) ×2
GOWN STRL REUS W/TWL XL LVL3 (GOWN DISPOSABLE) ×2
KIT TURNOVER KIT A (KITS) ×3 IMPLANT
NS IRRIG 1000ML POUR BTL (IV SOLUTION) ×3 IMPLANT
NS IRRIG 500ML POUR BTL (IV SOLUTION) ×3 IMPLANT
PACK EXTREMITY ARMC (MISCELLANEOUS) ×3 IMPLANT
PAD CAST CTTN 3X4 STRL (SOFTGOODS) ×1 IMPLANT
PAD PREP 24X41 OB/GYN DISP (PERSONAL CARE ITEMS) ×3 IMPLANT
SPLINT CAST 1 STEP 3X12 (MISCELLANEOUS) ×3 IMPLANT
SPONGE GAUZE 2X2 8PLY STER LF (GAUZE/BANDAGES/DRESSINGS) ×1
SPONGE GAUZE 2X2 8PLY STRL LF (GAUZE/BANDAGES/DRESSINGS) ×2 IMPLANT
STOCKINETTE IMPERVIOUS 9X36 MD (GAUZE/BANDAGES/DRESSINGS) ×3 IMPLANT
STOCKINETTE STRL 4IN 9604848 (GAUZE/BANDAGES/DRESSINGS) ×3 IMPLANT
SUT PROLENE 4 0 PS 2 18 (SUTURE) ×9 IMPLANT
SUT VIC AB 3-0 SH 27 (SUTURE) ×2
SUT VIC AB 3-0 SH 27X BRD (SUTURE) ×1 IMPLANT

## 2019-02-21 NOTE — Discharge Instructions (Addendum)
Orthopedic discharge instructions: Keep splint dry and intact. Keep hand elevated above heart level. Apply ice to affected area frequently. Take ibuprofen 800 mg TID with meals for 7-10 days, then as necessary. Take pain medication as prescribed or ES Tylenol when needed.  Return for follow-up in 10-14 days or as scheduled.   AMBULATORY SURGERY  DISCHARGE INSTRUCTIONS   1) The drugs that you were given will stay in your system until tomorrow so for the next 24 hours you should not:  A) Drive an automobile B) Make any legal decisions C) Drink any alcoholic beverage   2) You may resume regular meals tomorrow.  Today it is better to start with liquids and gradually work up to solid foods.  You may eat anything you prefer, but it is better to start with liquids, then soup and crackers, and gradually work up to solid foods.   3) Please notify your doctor immediately if you have any unusual bleeding, trouble breathing, redness and pain at the surgery site, drainage, fever, or pain not relieved by medication.    4) Additional Instructions:        Please contact your physician with any problems or Same Day Surgery at [phone], Monday through Friday 6 am to 4 pm, or [place] [facility] number at [phone].

## 2019-02-21 NOTE — Anesthesia Post-op Follow-up Note (Signed)
Anesthesia QCDR form completed.        

## 2019-02-21 NOTE — H&P (Signed)
Paper H&P to be scanned into permanent record. H&P reviewed and patient re-examined. No changes. 

## 2019-02-21 NOTE — Anesthesia Postprocedure Evaluation (Signed)
Anesthesia Post Note  Patient: Joel Campos  Procedure(s) Performed: DUPUYTREN CONTRACTURE RELEASE (Right Little Finger)  Patient location during evaluation: PACU Anesthesia Type: General Level of consciousness: awake and alert and oriented Pain management: pain level controlled Vital Signs Assessment: post-procedure vital signs reviewed and stable Respiratory status: spontaneous breathing, nonlabored ventilation and respiratory function stable Cardiovascular status: blood pressure returned to baseline and stable Postop Assessment: no signs of nausea or vomiting Anesthetic complications: no     Last Vitals:  Vitals:   02/21/19 1137 02/21/19 1200  BP: (!) 168/93 (!) 148/92  Pulse: 88 82  Resp: 16 16  Temp:    SpO2: 95% 95%    Last Pain:  Vitals:   02/21/19 1200  TempSrc:   PainSc: 3                  Sahid Borba

## 2019-02-21 NOTE — Op Note (Signed)
02/21/2019  10:48 AM  Patient:   Joel Campos  Pre-Op Diagnosis:   Dupuytren's contractures, right ring and little fingers.  Post-Op Diagnosis:   Same.  Procedure:   Release of Dupuytren's contractures, right ring and little fingers.  Surgeon:   Joel Lux, MD  Assistant:   None  Anesthesia:   General LMA  Findings:   As above.  Complications:   None  EBL:   0 cc  Fluids:   750 cc crystalloid  TT:   58 minutes at 250 mmHg  Drains:   None  Closure:   4-0 Prolene interrupted sutures  Brief Clinical Note:   The patient is a 58 year old male with a history of progressively worsening contracture of the right little finger. His symptoms have progressed despite medications, activity modification, etc. His history and examination are consistent with a Dupuytren's contracture involving the right little PIP and DIP joints. The patient also has an area of contracture in the palmar aspect of his hand overlying the ring metacarpal head which he finds quite uncomfortable which he would like to have removed as well. The patient presents at this time for release of the Dupuytren's contractures of the right ring and little fingers.  Procedure:   The patient was brought into the operating room and lain in the supine position. After adequate general laryngeal mask anesthesia was achieved, the right hand and upper extremity were prepped with ChloraPrep solution before being draped sterilely. Preoperative antibiotics were administered. After performing a timeout to verify the appropriate surgical site, a Erick Blinks type zigzag incision was made along the volar aspect of the right little finger beginning at the MCP flexion crease and extending distally beyond the DIP flexion crease. The incision was carried out through the subcutaneous tissues to expose the fibrous cord. The cord was identified and carefully dissected out from proximal to distal after releasing approximately. As the dissection was  being carried out, care was taken to identify and protect the ulnar digital nerve and artery. Once the mass was removed in its entirety, the adequacy of excision was verified by palpation as well as visually. The PIP joint could be extended to 0 degrees while the DIP joint still lacked 10 degrees of extension. Therefore, the DIP joint was gently manipulated to permit full extension.  A second Brunner type zigzag incision was made over the area of Dupuytren's contracture in the palm of the hand overlying the distal portion of the ring metacarpal and extending to the ring metacarpal head. The incision began just proximal to the proximal palmar crease and ended just proximal to the MCP flexion crease. The incision was carried down through subcutaneous tissues. The fibrous cord was identified and carefully dissected out from proximal to distal after releasing it proximally. As dissection was carried out, care was taken to identify and protect the common digital nerve and artery on either side of the cord, as well as the underlying flexor tendon. After the mass was removed in its entirety, the adequacy of excision was verified by palpation as well as visually. After excision of the Dupuytren's tissue, the right ring finger MCP and PIP joints could be extended fully.  The wounds were copiously irrigated with sterile saline solution before the skin was reapproximated using 4-0 Prolene interrupted sutures. A total of 10 cc of 0.25% plain Sensorcaine was injected in and around the incisions to help with postoperative analgesia before a sterile bulky dressing and volar splint extending to the fingertips was applied,  maintaining the MCP joints in extension. The patient was then awakened and returned to the recovery room in satisfactory condition after tolerating the procedure well.

## 2019-02-21 NOTE — Anesthesia Preprocedure Evaluation (Signed)
Anesthesia Evaluation  Patient identified by MRN, date of birth, ID band Patient awake    Reviewed: Allergy & Precautions, NPO status , Patient's Chart, lab work & pertinent test results  History of Anesthesia Complications Negative for: history of anesthetic complications  Airway Mallampati: II  TM Distance: >3 FB Neck ROM: Full    Dental  (+) Edentulous Upper   Pulmonary neg sleep apnea, neg COPD, former smoker,    breath sounds clear to auscultation- rhonchi (-) wheezing      Cardiovascular hypertension, Pt. on medications (-) CAD, (-) Past MI, (-) Cardiac Stents and (-) CABG  Rhythm:Regular Rate:Normal - Systolic murmurs and - Diastolic murmurs    Neuro/Psych neg Seizures CVA, No Residual Symptoms negative psych ROS   GI/Hepatic Neg liver ROS, PUD, GERD  ,  Endo/Other  negative endocrine ROSneg diabetes  Renal/GU negative Renal ROS     Musculoskeletal negative musculoskeletal ROS (+)   Abdominal (+) - obese,   Peds  Hematology negative hematology ROS (+)   Anesthesia Other Findings Past Medical History: No date: CVA (cerebral vascular accident) ([place]) No date: GERD (gastroesophageal reflux disease) No date: History of multiple pulmonary nodules No date: Hypertension No date: Multiple gastric ulcers No date: Pancreatitis   Reproductive/Obstetrics                             Anesthesia Physical Anesthesia Plan  ASA: III  Anesthesia Plan: General   Post-op Pain Management:    Induction: Intravenous  PONV Risk Score and Plan: 1 and Ondansetron and Dexamethasone  Airway Management Planned: LMA  Additional Equipment:   Intra-op Plan:   Post-operative Plan:   Informed Consent: I have reviewed the patients History and Physical, chart, labs and discussed the procedure including the risks, benefits and alternatives for the proposed anesthesia with the patient or authorized  representative who has indicated his/her understanding and acceptance.     Dental advisory given  Plan Discussed with: CRNA and Anesthesiologist  Anesthesia Plan Comments:         Anesthesia Quick Evaluation

## 2019-02-21 NOTE — Anesthesia Procedure Notes (Signed)
Procedure Name: LMA Insertion Date/Time: 02/21/2019 9:18 AM Performed by: Dionne Bucy, CRNA Pre-anesthesia Checklist: Patient identified, Patient being monitored, Timeout performed, Emergency Drugs available and Suction available Patient Re-evaluated:Patient Re-evaluated prior to induction Oxygen Delivery Method: Circle system utilized Preoxygenation: Pre-oxygenation with 100% oxygen Induction Type: IV induction Ventilation: Mask ventilation without difficulty LMA: LMA inserted LMA Size: 4.0 Tube type: Oral Number of attempts: 1 Placement Confirmation: positive ETCO2 and breath sounds checked- equal and bilateral Tube secured with: Tape Dental Injury: Teeth and Oropharynx as per pre-operative assessment

## 2019-02-21 NOTE — Transfer of Care (Signed)
Immediate Anesthesia Transfer of Care Note  Patient: Joel Campos  Procedure(s) Performed: DUPUYTREN CONTRACTURE RELEASE (Right Little Finger)  Patient Location: PACU  Anesthesia Type:General  Level of Consciousness: sedated  Airway & Oxygen Therapy: Patient Spontanous Breathing and Patient connected to face mask oxygen  Post-op Assessment: Report given to RN and Post -op Vital signs reviewed and stable  Post vital signs: Reviewed and stable  Last Vitals:  Vitals Value Taken Time  BP 147/87   Temp 96.39F   Pulse 77 02/21/19 1044  Resp 15 02/21/19 1044  SpO2 98 % 02/21/19 1044  Vitals shown include unvalidated device data.  Last Pain:  Vitals:   02/21/19 0812  TempSrc: Temporal  PainSc: 0-No pain         Complications: No apparent anesthesia complications

## 2019-02-22 LAB — SURGICAL PATHOLOGY

## 2019-03-15 ENCOUNTER — Ambulatory Visit: Payer: BC Managed Care – PPO | Attending: Student | Admitting: Occupational Therapy

## 2019-03-15 ENCOUNTER — Other Ambulatory Visit: Payer: Self-pay

## 2019-03-15 ENCOUNTER — Encounter: Payer: Self-pay | Admitting: Occupational Therapy

## 2019-03-15 DIAGNOSIS — M6281 Muscle weakness (generalized): Secondary | ICD-10-CM | POA: Diagnosis present

## 2019-03-15 DIAGNOSIS — M25641 Stiffness of right hand, not elsewhere classified: Secondary | ICD-10-CM | POA: Diagnosis present

## 2019-03-15 DIAGNOSIS — M79641 Pain in right hand: Secondary | ICD-10-CM | POA: Diagnosis present

## 2019-03-15 DIAGNOSIS — L905 Scar conditions and fibrosis of skin: Secondary | ICD-10-CM | POA: Diagnosis not present

## 2019-03-15 NOTE — Therapy (Signed)
[facility] [address], [place], [address] Phone: [phone]   Fax:  [phone]  Occupational Therapy Evaluation  Patient Details  Name: Joel Campos MRN: [id number] Date of Birth: 03-12-1961 Referring Provider (OT): Dr Roland Rack   Encounter Date: 03/15/2019  OT End of Session - 03/15/19 1659    Visit Number  1    Number of Visits  12    Date for OT Re-Evaluation  04/26/19    OT Start Time  0815    OT Stop Time  0851    OT Time Calculation (min)  36 min    Activity Tolerance  Patient tolerated treatment well    Behavior During Therapy  [facility] for tasks assessed/performed       Past Medical History:  Diagnosis Date  . CVA (cerebral vascular accident) ([place])   . GERD (gastroesophageal reflux disease)   . History of multiple pulmonary nodules   . Hypertension   . Multiple gastric ulcers   . Pancreatitis     Past Surgical History:  Procedure Laterality Date  . COLONOSCOPY WITH PROPOFOL N/A 08/26/2017   Procedure: COLONOSCOPY WITH PROPOFOL;  Surgeon: Manya Silvas, MD;  Location: [facility] ENDOSCOPY;  Service: Endoscopy;  Laterality: N/A;  . DUPUYTREN CONTRACTURE RELEASE Right 02/21/2019   Procedure: DUPUYTREN CONTRACTURE RELEASE;  Surgeon: Corky Mull, MD;  Location: [facility] ORS;  Service: Orthopedics;  Laterality: Right;  . ESOPHAGOGASTRODUODENOSCOPY (EGD) WITH PROPOFOL N/A 08/26/2017   Procedure: ESOPHAGOGASTRODUODENOSCOPY (EGD) WITH PROPOFOL;  Surgeon: Manya Silvas, MD;  Location: [facility] ENDOSCOPY;  Service: Endoscopy;  Laterality: N/A;    There were no vitals filed for this visit.  Subjective Assessment - 03/15/19 0909    Subjective   My hand just tender and sore - and increase to about 6/10 when making fist - I cleaned up the scar little and was pushing on the pinkie to straighten it - but sore at scab on pinkie finger    Pertinent History  Pt had dupuytrens release on 02/21/2019 by Dr Roland Rack -  was in bandage and stitches come out 03/05/2019 - refer to hand therapy/OT    Patient Stated Goals  Want to get my R hand healed, motion and strength  better and the pain so I can use it and go back to work    Currently in Pain?  Yes    Pain Score  6     Pain Location  Hand    Pain Orientation  Right    Pain Descriptors / Indicators  Tender;Sore;Aching    Pain Type  Surgical pain    Pain Onset  1 to 4 weeks ago    Pain Frequency  Constant    Aggravating Factors   making fist        [facility] OT Assessment - 03/15/19 0001      Assessment   Medical Diagnosis  R dupuytrens release    Referring Provider (OT)  Dr Roland Rack    Onset Date/Surgical Date  02/21/19    Hand Dominance  Right    Next MD Visit  --   04/04/2018     Home  Environment   Lives With  Alone      Prior Function   Vocation  Full time employment    Leisure  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 - lift up to [place] , yard work and work around the house       Right Engineer, manufacturing  AROM   R Thumb Opposition to Index  --   Opposition to side of 5th - pull in pinkie   R Index  MCP 0-90  80 Degrees    R Index PIP 0-100  100 Degrees    R Long  MCP 0-90  80 Degrees    R Long PIP 0-100  95 Degrees    R Ring  MCP 0-90  80 Degrees    R Ring PIP 0-100  90 Degrees    R Little  MCP 0-90  80 Degrees    R Little PIP 0-100  80 Degrees   -25              OT Treatments/Exercises (OP) - 03/15/19 0001      RUE Contrast Bath   Time  9 minutes    Comments  prior to AROM and to decrease pain and stiffness       decrease pain after contrast  HEP review and hand out provided -scar education done and fitted with cica scar pad and silicon sleeve    Contrast 2-3 x day  Scar massage on areas that is healed  cica scar pad on palmar scar at night time  And silicon sleeve on 5th - 2 hrs on and off at time during day - but if scabs get to soft - take off  Tendon glides with only AROM for extention with each one  10 reps   Opposition to all digits     OT Education - 03/15/19 0916    Education Details  findings of eval and HEP    Person(s) Educated  Patient    Methods  Explanation;Demonstration;Tactile cues;Verbal cues;Handout    Comprehension  Verbal cues required;Returned demonstration;Verbalized understanding       OT Short Term Goals - 03/15/19 1706      OT SHORT TERM GOAL #1   Title  Pt to be independent in decrease pain on PRWHE by 20 points to increase AROM    Baseline  PRWHE pain at eval 32/50    Time  3    Period  Weeks    Status  New    Target Date  04/05/19        OT Long Term Goals - 03/15/19 1707      OT LONG TERM GOAL #1   Title  AROM for R 5th digit extention improve with 15 degrees without pain    Baseline  Pt was doing PROM and scab open and painfull at volar PIP - decrease extention at PIP - see flowsheet    Time  3    Period  Weeks    Status  New    Target Date  04/05/19      OT LONG TERM GOAL #2   Title  scar tissue improve for pt to tolerate different texture and to touch palm to be able to hold tools    Baseline  decrase flexion - see flowsheet - and tender and pain over scars - 6/10    Time  6    Period  Weeks    Status  New    Target Date  04/26/19      OT LONG TERM GOAL #3   Title  function on PRWHE improve wiht more than 20 points    Baseline  PRHWE function score at eval 22/50    Time  6    Period  Weeks    Status  New    Target Date  04/26/19            Plan - 03/15/19 1701    Clinical Impression Statement  Pt present at OT eval 3 wks s/p R dupuytrens release in palm and volar 5th digits- incision healed - but has 1cm area on volar 5th PIP that has scab and causing some pain - pt pain 4-6/10 pain - but per pt he was doing some PROM PIP extention - pt to hold off on PROM - causing some opening of scab at PIP - pt show increase pain and scar tissue , decrase ROM and strenght - limitng his functional use R dominant hand in ADL's and IADL's    OT  Occupational Profile and History  Problem Focused Assessment - Including review of records relating to presenting problem    Occupational performance deficits (Please refer to evaluation for details):  ADL's;IADL's;Work;Play;Leisure;Social Participation    Body Structure / Function / Physical Skills  ADL;ROM;UE functional use;Scar mobility;Skin integrity;Strength;Pain;IADL;Edema    Rehab Potential  Good    Clinical Decision Making  Limited treatment options, no task modification necessary    Comorbidities Affecting Occupational Performance:  None    Modification or Assistance to Complete Evaluation   No modification of tasks or assist necessary to complete eval    OT Frequency  2x / week    OT Duration  6 weeks    OT Treatment/Interventions  Self-care/ADL training;Therapeutic exercise;Patient/family education;Scar mobilization;Manual Therapy;Passive range of motion;Contrast Bath;Paraffin;Fluidtherapy    Plan  assess progress with HEP and update HEP as needed    OT Home Exercise Plan  see pt instruction    Consulted and Agree with Plan of Care  Patient       Patient will benefit from skilled therapeutic intervention in order to improve the following deficits and impairments:   Body Structure / Function / Physical Skills: ADL, ROM, UE functional use, Scar mobility, Skin integrity, Strength, Pain, IADL, Edema       Visit Diagnosis: Scar condition and fibrosis of skin - Plan: Ot plan of care cert/re-cert  Stiffness of right hand, not elsewhere classified - Plan: Ot plan of care cert/re-cert  Pain in right hand - Plan: Ot plan of care cert/re-cert  Muscle weakness (generalized) - Plan: Ot plan of care cert/re-cert    Problem List There are no problems to display for this patient.   Rosalyn Gess OTR/l,CLT 03/15/2019, 5:12 PM  [facility] [address], [place], [address] Phone: [phone]   Fax:   [phone]  Name: Joel Campos MRN: [id number] Date of Birth: Oct 16, 1960

## 2019-03-15 NOTE — Patient Instructions (Signed)
Contrast 2-3 x day  Scar massage on areas that is healed  cica scar pad on palmar scar at night time  And silicon sleeve on 5th - 2 hrs on and off at time during day - but if scabs get to soft - take off  Tendon glides with only AROM for extention with each one  10 reps  Opposition to all digits

## 2019-03-22 ENCOUNTER — Other Ambulatory Visit: Payer: Self-pay

## 2019-03-22 ENCOUNTER — Ambulatory Visit: Payer: BC Managed Care – PPO | Attending: Student | Admitting: Occupational Therapy

## 2019-03-22 DIAGNOSIS — M6281 Muscle weakness (generalized): Secondary | ICD-10-CM | POA: Diagnosis present

## 2019-03-22 DIAGNOSIS — M25641 Stiffness of right hand, not elsewhere classified: Secondary | ICD-10-CM | POA: Diagnosis present

## 2019-03-22 DIAGNOSIS — L905 Scar conditions and fibrosis of skin: Secondary | ICD-10-CM | POA: Insufficient documentation

## 2019-03-22 DIAGNOSIS — M79641 Pain in right hand: Secondary | ICD-10-CM | POA: Diagnosis present

## 2019-03-22 NOTE — Therapy (Signed)
[facility] [address] [place], [address] Phone: [phone]   Fax:  [phone]  Occupational Therapy Treatment  Patient Details  Name: Joel Campos MRN: [id number] Date of Birth: [birth year] Referring Provider (OT): Dr Roland Rack   Encounter Date: 03/22/2019  OT End of Session - 03/22/19 0829    Visit Number  2    Number of Visits  12    Date for OT Re-Evaluation  04/26/19    OT Start Time  0815    OT Stop Time  0843    OT Time Calculation (min)  28 min    Activity Tolerance  Patient tolerated treatment well    Behavior During Therapy  [facility] for tasks assessed/performed       Past Medical History:  Diagnosis Date  . CVA (cerebral vascular accident) ([place])   . GERD (gastroesophageal reflux disease)   . History of multiple pulmonary nodules   . Hypertension   . Multiple gastric ulcers   . Pancreatitis     Past Surgical History:  Procedure Laterality Date  . COLONOSCOPY WITH PROPOFOL N/A 08/26/2017   Procedure: COLONOSCOPY WITH PROPOFOL;  Surgeon: Manya Silvas, MD;  Location: [facility] ENDOSCOPY;  Service: Endoscopy;  Laterality: N/A;  . DUPUYTREN CONTRACTURE RELEASE Right 02/21/2019   Procedure: DUPUYTREN CONTRACTURE RELEASE;  Surgeon: Corky Mull, MD;  Location: [facility] ORS;  Service: Orthopedics;  Laterality: Right;  . ESOPHAGOGASTRODUODENOSCOPY (EGD) WITH PROPOFOL N/A 08/26/2017   Procedure: ESOPHAGOGASTRODUODENOSCOPY (EGD) WITH PROPOFOL;  Surgeon: Manya Silvas, MD;  Location: [facility] ENDOSCOPY;  Service: Endoscopy;  Laterality: N/A;    There were no vitals filed for this visit.  Subjective Assessment - 03/22/19 0827    Subjective   Everything is better scar wise , pain , motion and using it more - but pinkie not straight yet    Pertinent History  Pt had dupuytrens release on 02/21/2019 by Dr Roland Rack - was in bandage and stitches come out 03/05/2019 - refer to hand therapy/OT    Patient Stated  Goals  Want to get my R hand healed, motion and strength  better and the pain so I can use it and go back to work    Currently in Pain?  Yes    Pain Score  2     Pain Location  Finger (Comment which one)    Pain Orientation  Right    Pain Descriptors / Indicators  Tender;Tightness;Aching    Pain Type  Surgical pain    Pain Onset  1 to 4 weeks ago    Pain Frequency  Constant         [facility] OT Assessment - 03/22/19 0001      Right Hand AROM   R Ring  MCP 0-90  85 Degrees    R Ring PIP 0-100  95 Degrees    R Little  MCP 0-90  90 Degrees    R Little PIP 0-100  90 Degrees   -25     flexion improve and pain at PIP - still 2/10 at rest - but better           OT Treatments/Exercises (OP) - 03/22/19 0001      RUE Paraffin   Number Minutes Paraffin  8 Minutes    RUE Paraffin Location  Hand    Comments  prior to soft tissue and scar massage - use of LMB PIP extention       scar massage  done by OT and vibration used - tolerate well  Fitted with LMB extention splint for 5th PIP - ed on use and precautions - after 3 min extention  Improve to -15  Pt to use up to 5 x day 2-3 min   extnetion of PIP done   tendon glides  Edema improving in 5th - and fitted with new silicon compression sleeve on 5th  Cont night time with cica scar pad       OT Education - 03/22/19 0829    Education Details  progress , LMB splint use and changes to HEP    Person(s) Educated  Patient    Methods  Explanation;Demonstration;Tactile cues;Verbal cues;Handout    Comprehension  Verbal cues required;Returned demonstration;Verbalized understanding       OT Short Term Goals - 03/15/19 1706      OT SHORT TERM GOAL #1   Title  Pt to be independent in decrease pain on PRWHE by 20 points to increase AROM    Baseline  PRWHE pain at eval 32/50    Time  3    Period  Weeks    Status  New    Target Date  04/05/19        OT Long Term Goals - 03/15/19 1707      OT LONG TERM GOAL #1   Title  AROM  for R 5th digit extention improve with 15 degrees without pain    Baseline  Pt was doing PROM and scab open and painfull at volar PIP - decrease extention at PIP - see flowsheet    Time  3    Period  Weeks    Status  New    Target Date  04/05/19      OT LONG TERM GOAL #2   Title  scar tissue improve for pt to tolerate different texture and to touch palm to be able to hold tools    Baseline  decrase flexion - see flowsheet - and tender and pain over scars - 6/10    Time  6    Period  Weeks    Status  New    Target Date  04/26/19      OT LONG TERM GOAL #3   Title  function on PRWHE improve wiht more than 20 points    Baseline  PRHWE function score at eval 22/50    Time  6    Period  Weeks    Status  New    Target Date  04/26/19            Plan - 03/22/19 0829    Clinical Impression Statement  Pt is 4 wks and 1 day s/p R dupuytrens release in palm and volar 5th digits - insciision healed - scabs coming off except volar PIP of 5th still thick and tender - tolerating scar mobs and AROM increase - did add this date LMB splint for PIP extention to use few times during day - without increase pain    OT Occupational Profile and History  Problem Focused Assessment - Including review of records relating to presenting problem    Occupational performance deficits (Please refer to evaluation for details):  ADL's;IADL's;Work;Play;Leisure;Social Participation    Body Structure / Function / Physical Skills  ADL;ROM;UE functional use;Scar mobility;Skin integrity;Strength;Pain;IADL;Edema    Rehab Potential  Good    Clinical Decision Making  Limited treatment options, no task modification necessary    Comorbidities Affecting Occupational Performance:  None    Modification  or Assistance to Complete Evaluation   No modification of tasks or assist necessary to complete eval    OT Frequency  1x / week    OT Duration  --   5 wks   OT Treatment/Interventions  Self-care/ADL training;Therapeutic  exercise;Patient/family education;Scar mobilization;Manual Therapy;Passive range of motion;Contrast Bath;Paraffin;Fluidtherapy    Plan  assess extention of PIP 5th - use of LMB splint    OT Home Exercise Plan  see pt instruction    Consulted and Agree with Plan of Care  Patient       Patient will benefit from skilled therapeutic intervention in order to improve the following deficits and impairments:   Body Structure / Function / Physical Skills: ADL, ROM, UE functional use, Scar mobility, Skin integrity, Strength, Pain, IADL, Edema       Visit Diagnosis: Stiffness of right hand, not elsewhere classified  Pain in right hand  Muscle weakness (generalized)  Scar condition and fibrosis of skin    Problem List There are no problems to display for this patient.   Rosalyn Gess OTR/l,CLT 03/22/2019, 8:49 AM  [facility] [address], [place], [address] Phone: [phone]   Fax:  [phone]  Name: Joel Campos MRN: [id number] Date of Birth: May 17, 1960

## 2019-03-22 NOTE — Patient Instructions (Signed)
Same HEP but use LMB PIP extention splint on 5th 2-3 min 5 x day

## 2019-03-30 ENCOUNTER — Other Ambulatory Visit: Payer: Self-pay

## 2019-03-30 ENCOUNTER — Ambulatory Visit: Payer: BC Managed Care – PPO | Admitting: Occupational Therapy

## 2019-03-30 DIAGNOSIS — M79641 Pain in right hand: Secondary | ICD-10-CM

## 2019-03-30 DIAGNOSIS — M6281 Muscle weakness (generalized): Secondary | ICD-10-CM

## 2019-03-30 DIAGNOSIS — M25641 Stiffness of right hand, not elsewhere classified: Secondary | ICD-10-CM

## 2019-03-30 DIAGNOSIS — L905 Scar conditions and fibrosis of skin: Secondary | ICD-10-CM

## 2019-03-30 NOTE — Therapy (Signed)
[facility] [address], [place], [address] Phone: [phone]   Fax:  [phone]  Occupational Therapy Treatment  Patient Details  Name: Joel Campos MRN: [id number] Date of Birth: 04/16/1960 Referring Provider (OT): Dr Roland Rack   Encounter Date: 03/30/2019  OT End of Session - 03/30/19 0907    Visit Number  3    Number of Visits  12    Date for OT Re-Evaluation  04/26/19    OT Start Time  0831    OT Stop Time  0905    OT Time Calculation (min)  34 min    Activity Tolerance  Patient tolerated treatment well    Behavior During Therapy  [facility] for tasks assessed/performed       Past Medical History:  Diagnosis Date  . CVA (cerebral vascular accident) ([place])   . GERD (gastroesophageal reflux disease)   . History of multiple pulmonary nodules   . Hypertension   . Multiple gastric ulcers   . Pancreatitis     Past Surgical History:  Procedure Laterality Date  . COLONOSCOPY WITH PROPOFOL N/A 08/26/2017   Procedure: COLONOSCOPY WITH PROPOFOL;  Surgeon: Manya Silvas, MD;  Location: [facility] ENDOSCOPY;  Service: Endoscopy;  Laterality: N/A;  . DUPUYTREN CONTRACTURE RELEASE Right 02/21/2019   Procedure: DUPUYTREN CONTRACTURE RELEASE;  Surgeon: Corky Mull, MD;  Location: [facility] ORS;  Service: Orthopedics;  Laterality: Right;  . ESOPHAGOGASTRODUODENOSCOPY (EGD) WITH PROPOFOL N/A 08/26/2017   Procedure: ESOPHAGOGASTRODUODENOSCOPY (EGD) WITH PROPOFOL;  Surgeon: Manya Silvas, MD;  Location: [facility] ENDOSCOPY;  Service: Endoscopy;  Laterality: N/A;    There were no vitals filed for this visit.  Subjective Assessment - 03/30/19 0905    Subjective   Pain is better - but tender over the finger - and stiff when making fist    Pertinent History  Pt had dupuytrens release on 02/21/2019 by Dr Roland Rack - was in bandage and stitches come out 03/05/2019 - refer to hand therapy/OT    Patient Stated Goals  Want to get my R  hand healed, motion and strength  better and the pain so I can use it and go back to work    Currently in Pain?  Yes    Pain Score  2     Pain Location  Hand    Pain Orientation  Right    Pain Descriptors / Indicators  Tender    Pain Type  Surgical pain    Pain Onset  More than a month ago    Pain Frequency  Constant         [facility] OT Assessment - 03/30/19 0001      Strength   Right Hand Grip (lbs)  62    Right Hand Lateral Pinch  18 lbs    Right Hand 3 Point Pinch  19 lbs    Left Hand Grip (lbs)  79    Left Hand Lateral Pinch  19 lbs    Left Hand 3 Point Pinch  20 lbs      Right Hand AROM   R Ring  MCP 0-90  85 Degrees    R Ring PIP 0-100  100 Degrees    R Little  MCP 0-90  90 Degrees    R Little PIP 0-100  80 Degrees   -30     assess AROM and grip/prehension strength- see flowsheet   PIP flexion decrease by 5 degrees- edema in 5th - fitted with  new silicon digi sleeve  And extention lag -30 this date - last time -25  pt to focus on scar massage and PIP extention  Pain 4/10 with gripping - during testing on 5th          OT Treatments/Exercises (OP) - 03/30/19 0001      RUE Paraffin   Number Minutes Paraffin  8 Minutes    RUE Paraffin Location  Hand    Comments  prior to soft tissue mobs and ROM         scar massage done by OT and vibration used - tolerate well  Tender over volar 5th digit at crease of PIP and MC   Pt to hold off on  LMB extention splint for 5th PIP - report some numbness if used for few min   Pt to do PIP stretch with pressure behind PIP on table - 5 x 30 sec  AROM into table for PIP extention    tendon glides for flexion still - but focus on PIP extention  Edema in 5th still increase  - and fitted with new silicon compression sleeves on 5th  Night time and about 40% during day wear  Rolling putty - firm green for scar mobs and extnetion of 5th - but NO gripping  Cont night time with cica scar pad       OT Education - 03/30/19 0906     Education Details  HEP and change of HEP    Person(s) Educated  Patient    Methods  Explanation;Demonstration;Tactile cues;Verbal cues;Handout    Comprehension  Verbal cues required;Returned demonstration;Verbalized understanding       OT Short Term Goals - 03/15/19 1706      OT SHORT TERM GOAL #1   Title  Pt to be independent in decrease pain on PRWHE by 20 points to increase AROM    Baseline  PRWHE pain at eval 32/50    Time  3    Period  Weeks    Status  New    Target Date  04/05/19        OT Long Term Goals - 03/15/19 1707      OT LONG TERM GOAL #1   Title  AROM for R 5th digit extention improve with 15 degrees without pain    Baseline  Pt was doing PROM and scab open and painfull at volar PIP - decrease extention at PIP - see flowsheet    Time  3    Period  Weeks    Status  New    Target Date  04/05/19      OT LONG TERM GOAL #2   Title  scar tissue improve for pt to tolerate different texture and to touch palm to be able to hold tools    Baseline  decrase flexion - see flowsheet - and tender and pain over scars - 6/10    Time  6    Period  Weeks    Status  New    Target Date  04/26/19      OT LONG TERM GOAL #3   Title  function on PRWHE improve wiht more than 20 points    Baseline  PRHWE function score at eval 22/50    Time  6    Period  Weeks    Status  New    Target Date  04/26/19            Plan - 03/30/19 0907    Clinical Impression  Statement  Pt is just over 5 wks s/p R dupuytrens release in palm and volar 5th digits - pt scar healed  - scar still tender and tight in volar 5th digit - feels great in palm - to cont compression on 5th for edema nad scar tissue , and focus on PIP extention    OT Occupational Profile and History  Problem Focused Assessment - Including review of records relating to presenting problem    Occupational performance deficits (Please refer to evaluation for details):  ADL's;IADL's;Work;Play;Leisure;Social Participation     Body Structure / Function / Physical Skills  ADL;ROM;UE functional use;Scar mobility;Skin integrity;Strength;Pain;IADL;Edema    Rehab Potential  Good    Clinical Decision Making  Limited treatment options, no task modification necessary    Comorbidities Affecting Occupational Performance:  None    Modification or Assistance to Complete Evaluation   No modification of tasks or assist necessary to complete eval    OT Frequency  1x / week    OT Duration  4 weeks    OT Treatment/Interventions  Self-care/ADL training;Therapeutic exercise;Patient/family education;Scar mobilization;Manual Therapy;Passive range of motion;Contrast Bath;Paraffin;Fluidtherapy    Plan  assess extention of PIP 5th - PROM and rolling putty- assess PIP extention    OT Home Exercise Plan  see pt instruction    Consulted and Agree with Plan of Care  Patient       Patient will benefit from skilled therapeutic intervention in order to improve the following deficits and impairments:   Body Structure / Function / Physical Skills: ADL, ROM, UE functional use, Scar mobility, Skin integrity, Strength, Pain, IADL, Edema       Visit Diagnosis: Stiffness of right hand, not elsewhere classified  Pain in right hand  Muscle weakness (generalized)  Scar condition and fibrosis of skin    Problem List There are no problems to display for this patient.   Rosalyn Gess OTR/L,CLT 03/30/2019, 9:09 AM  [facility] [address], [place], [address] Phone: [phone]   Fax:  [phone]  Name: DEJAN CARNEVALE MRN: [id number] Date of Birth: [birth year]

## 2019-03-30 NOTE — Patient Instructions (Signed)
Replace using LMB splint with stretch behind PIP joint - for extention  5 x 30 sec  And rolling putty for scar mobs and extention of 5th

## 2019-04-06 ENCOUNTER — Ambulatory Visit: Payer: BC Managed Care – PPO | Admitting: Occupational Therapy

## 2019-04-06 ENCOUNTER — Other Ambulatory Visit: Payer: Self-pay

## 2019-04-06 DIAGNOSIS — M79641 Pain in right hand: Secondary | ICD-10-CM

## 2019-04-06 DIAGNOSIS — M25641 Stiffness of right hand, not elsewhere classified: Secondary | ICD-10-CM | POA: Diagnosis not present

## 2019-04-06 DIAGNOSIS — L905 Scar conditions and fibrosis of skin: Secondary | ICD-10-CM

## 2019-04-06 DIAGNOSIS — M6281 Muscle weakness (generalized): Secondary | ICD-10-CM

## 2019-04-06 NOTE — Therapy (Signed)
[facility] [address], [place], [address] Phone: [phone]   Fax:  [phone]  Occupational Therapy Treatment  Patient Details  Name: Joel Campos MRN: [id number] Date of Birth: [birth year] Referring Provider (OT): Dr Roland Rack   Encounter Date: 04/06/2019  OT End of Session - 04/06/19 0844    Visit Number  4    Number of Visits  12    Date for OT Re-Evaluation  04/26/19    OT Start Time  0830    OT Stop Time  0901    OT Time Calculation (min)  31 min    Activity Tolerance  Patient tolerated treatment well    Behavior During Therapy  [facility] for tasks assessed/performed       Past Medical History:  Diagnosis Date  . CVA (cerebral vascular accident) ([place])   . GERD (gastroesophageal reflux disease)   . History of multiple pulmonary nodules   . Hypertension   . Multiple gastric ulcers   . Pancreatitis     Past Surgical History:  Procedure Laterality Date  . COLONOSCOPY WITH PROPOFOL N/A 08/26/2017   Procedure: COLONOSCOPY WITH PROPOFOL;  Surgeon: Manya Silvas, MD;  Location: [facility] ENDOSCOPY;  Service: Endoscopy;  Laterality: N/A;  . DUPUYTREN CONTRACTURE RELEASE Right 02/21/2019   Procedure: DUPUYTREN CONTRACTURE RELEASE;  Surgeon: Corky Mull, MD;  Location: [facility] ORS;  Service: Orthopedics;  Laterality: Right;  . ESOPHAGOGASTRODUODENOSCOPY (EGD) WITH PROPOFOL N/A 08/26/2017   Procedure: ESOPHAGOGASTRODUODENOSCOPY (EGD) WITH PROPOFOL;  Surgeon: Manya Silvas, MD;  Location: [facility] ENDOSCOPY;  Service: Endoscopy;  Laterality: N/A;    There were no vitals filed for this visit.  Subjective Assessment - 04/06/19 0841    Subjective   NO pain - just stiff and tight when making fist - rolling the putty feels really good - just weak making tight fist after few reps and some swelling    Pertinent History  Pt had dupuytrens release on 02/21/2019 by Dr Roland Rack - was in bandage and stitches come out  03/05/2019 - refer to hand therapy/OT    Patient Stated Goals  Want to get my R hand healed, motion and strength  better and the pain so I can use it and go back to work    Currently in Pain?  No/denies         [facility] OT Assessment - 04/06/19 0001      Right Hand AROM   R Little  MCP 0-90  90 Degrees    R Little PIP 0-100  90 Degrees   -20     Pt PIP extention improve this date to -20 - last time lost some  Flexion at PIP improve also at the same time 10 degrees  pt to focus on scar tissue , flexibility and edema  Doing great with pain this date - did not had pain with grip strength - and increase to 66 lbs on the L this date          OT Treatments/Exercises (OP) - 04/06/19 0001      RUE Paraffin   Number Minutes Paraffin  8 Minutes    RUE Paraffin Location  Hand    Comments  LMB splint on 5th - for PIP exten       scar massage done by OT and vibration used - tolerate well - scar softer and increase flexibility at PIP  Less tender over volar 5th digit this date  Pt to do PIP stretch with pressure behind PIP on table - 5 x 30 sec - better success with that than LMB splint  AROM into table for PIP extention   tendon glides for flexion still - but focus on PIP extention  Edema in 5th still increase  - and fitted with new silicon compression sleeves on 5th  Night time and about 40% during day wear  Rolling putty - firm green for scar mobs and extention of 5th - but NO gripping still Cont night time with cica scar pad( new on provided)         OT Education - 04/06/19 0844    Education Details  HEP and change of HEP    Person(s) Educated  Patient    Methods  Explanation;Demonstration;Tactile cues;Verbal cues;Handout    Comprehension  Verbal cues required;Returned demonstration;Verbalized understanding       OT Short Term Goals - 03/15/19 1706      OT SHORT TERM GOAL #1   Title  Pt to be independent in decrease pain on PRWHE by 20 points to increase AROM     Baseline  PRWHE pain at eval 32/50    Time  3    Period  Weeks    Status  New    Target Date  04/05/19        OT Long Term Goals - 03/15/19 1707      OT LONG TERM GOAL #1   Title  AROM for R 5th digit extention improve with 15 degrees without pain    Baseline  Pt was doing PROM and scab open and painfull at volar PIP - decrease extention at PIP - see flowsheet    Time  3    Period  Weeks    Status  New    Target Date  04/05/19      OT LONG TERM GOAL #2   Title  scar tissue improve for pt to tolerate different texture and to touch palm to be able to hold tools    Baseline  decrase flexion - see flowsheet - and tender and pain over scars - 6/10    Time  6    Period  Weeks    Status  New    Target Date  04/26/19      OT LONG TERM GOAL #3   Title  function on PRWHE improve wiht more than 20 points    Baseline  PRHWE function score at eval 22/50    Time  6    Period  Weeks    Status  New    Target Date  04/26/19            Plan - 04/06/19 0844    Clinical Impression Statement  Pt is about 6 wks s/p R dupuytrens release in palm and volar 5th digit -making great progress in scar tissue healing , AROM - extention improve this date to -20  and flexion at PIP to 90 - did not had pain with grip strength ( improve to 66lbs)  - but reinforce for pt to focus on scar tissue , flexibility and edema - not strength    OT Occupational Profile and History  Problem Focused Assessment - Including review of records relating to presenting problem    Occupational performance deficits (Please refer to evaluation for details):  ADL's;IADL's;Work;Play;Leisure;Social Participation    Body Structure / Function / Physical Skills  ADL;ROM;UE functional use;Scar mobility;Skin integrity;Strength;Pain;IADL;Edema    Rehab Potential  Good  Clinical Decision Making  Limited treatment options, no task modification necessary    Comorbidities Affecting Occupational Performance:  None    Modification or  Assistance to Complete Evaluation   No modification of tasks or assist necessary to complete eval    OT Frequency  Biweekly    OT Duration  --   3wks   OT Treatment/Interventions  Self-care/ADL training;Therapeutic exercise;Patient/family education;Scar mobilization;Manual Therapy;Passive range of motion;Contrast Bath;Paraffin;Fluidtherapy    Plan  assess extention of PIP 5th - PROM and rolling putty- assess PIP extention    OT Home Exercise Plan  see pt instruction    Consulted and Agree with Plan of Care  Patient       Patient will benefit from skilled therapeutic intervention in order to improve the following deficits and impairments:   Body Structure / Function / Physical Skills: ADL, ROM, UE functional use, Scar mobility, Skin integrity, Strength, Pain, IADL, Edema       Visit Diagnosis: Pain in right hand  Muscle weakness (generalized)  Scar condition and fibrosis of skin  Stiffness of right hand, not elsewhere classified    Problem List There are no problems to display for this patient.   Rosalyn Gess  OTR/L,CLT 04/06/2019, 9:07 AM  [place] [facility] [address], [place], [address] Phone: [phone]   Fax:  [phone]  Name: Joel Campos MRN: [id number] Date of Birth: 1961/01/02

## 2019-04-06 NOTE — Patient Instructions (Signed)
same

## 2019-04-20 ENCOUNTER — Other Ambulatory Visit: Payer: Self-pay

## 2019-04-20 ENCOUNTER — Ambulatory Visit: Payer: BC Managed Care – PPO | Attending: Student | Admitting: Occupational Therapy

## 2019-04-20 DIAGNOSIS — M6281 Muscle weakness (generalized): Secondary | ICD-10-CM | POA: Diagnosis present

## 2019-04-20 DIAGNOSIS — M79641 Pain in right hand: Secondary | ICD-10-CM | POA: Diagnosis not present

## 2019-04-20 DIAGNOSIS — M25641 Stiffness of right hand, not elsewhere classified: Secondary | ICD-10-CM | POA: Diagnosis present

## 2019-04-20 DIAGNOSIS — L905 Scar conditions and fibrosis of skin: Secondary | ICD-10-CM | POA: Insufficient documentation

## 2019-04-20 NOTE — Therapy (Signed)
[facility] [address] [place], [address] Phone: [phone]   Fax:  [phone]  Occupational Therapy Treatment  Patient Details  Name: Joel Campos MRN: [id number] Date of Birth: 21-Oct-1960 Referring Provider (OT): Dr Roland Rack   Encounter Date: 04/20/2019  OT End of Session - 04/20/19 0916    Visit Number  5    Number of Visits  12    Date for OT Re-Evaluation  04/26/19    OT Start Time  0832    OT Stop Time  0912    OT Time Calculation (min)  40 min    Activity Tolerance  Patient tolerated treatment well    Behavior During Therapy  [facility] for tasks assessed/performed       Past Medical History:  Diagnosis Date  . CVA (cerebral vascular accident) ([place])   . GERD (gastroesophageal reflux disease)   . History of multiple pulmonary nodules   . Hypertension   . Multiple gastric ulcers   . Pancreatitis     Past Surgical History:  Procedure Laterality Date  . COLONOSCOPY WITH PROPOFOL N/A 08/26/2017   Procedure: COLONOSCOPY WITH PROPOFOL;  Surgeon: Manya Silvas, MD;  Location: [facility] ENDOSCOPY;  Service: Endoscopy;  Laterality: N/A;  . DUPUYTREN CONTRACTURE RELEASE Right 02/21/2019   Procedure: DUPUYTREN CONTRACTURE RELEASE;  Surgeon: Corky Mull, MD;  Location: [facility] ORS;  Service: Orthopedics;  Laterality: Right;  . ESOPHAGOGASTRODUODENOSCOPY (EGD) WITH PROPOFOL N/A 08/26/2017   Procedure: ESOPHAGOGASTRODUODENOSCOPY (EGD) WITH PROPOFOL;  Surgeon: Manya Silvas, MD;  Location: [facility] ENDOSCOPY;  Service: Endoscopy;  Laterality: N/A;    There were no vitals filed for this visit.  Subjective Assessment - 04/20/19 0914    Subjective   Doing okay- the scar still on my pinkie is tender - but using it -and rolling the putty- pushing down behind that middle joint - did not do my exercises this morning yet    Pertinent History  Pt had dupuytrens release on 02/21/2019 by Dr Roland Rack - was in bandage and  stitches come out 03/05/2019 - refer to hand therapy/OT    Patient Stated Goals  Want to get my R hand healed, motion and strength  better and the pain so I can use it and go back to work    Currently in Pain?  Yes    Pain Score  2     Pain Location  Finger (Comment which one)    Pain Orientation  Right    Pain Descriptors / Indicators  Tender    Pain Type  Surgical pain    Pain Onset  More than a month ago    Pain Frequency  Intermittent         [facility] OT Assessment - 04/20/19 0001      Edema   Edema  PIP 5th 6.3 cm, L 5.5 cm       Strength   Right Hand Grip (lbs)  60   did not do HEP this am      Right Hand AROM   R Little  MCP 0-90  90 Degrees    R Little PIP 0-100  90 Degrees   -15 lateral side              OT Treatments/Exercises (OP) - 04/20/19 0001      RUE Paraffin   Number Minutes Paraffin  8 Minutes    RUE Paraffin Location  Hand    Comments  prior  to ROM and splint fabirciation             OT Education - 04/20/19 0916    Education Details  HEP and splint wearing    Person(s) Educated  Patient    Methods  Explanation;Demonstration;Tactile cues;Verbal cues;Handout    Comprehension  Verbal cues required;Returned demonstration;Verbalized understanding       OT Short Term Goals - 03/15/19 1706      OT SHORT TERM GOAL #1   Title  Pt to be independent in decrease pain on PRWHE by 20 points to increase AROM    Baseline  PRWHE pain at eval 32/50    Time  3    Period  Weeks    Status  New    Target Date  04/05/19        OT Long Term Goals - 03/15/19 1707      OT LONG TERM GOAL #1   Title  AROM for R 5th digit extention improve with 15 degrees without pain    Baseline  Pt was doing PROM and scab open and painfull at volar PIP - decrease extention at PIP - see flowsheet    Time  3    Period  Weeks    Status  New    Target Date  04/05/19      OT LONG TERM GOAL #2   Title  scar tissue improve for pt to tolerate different texture and to  touch palm to be able to hold tools    Baseline  decrase flexion - see flowsheet - and tender and pain over scars - 6/10    Time  6    Period  Weeks    Status  New    Target Date  04/26/19      OT LONG TERM GOAL #3   Title  function on PRWHE improve wiht more than 20 points    Baseline  PRHWE function score at eval 22/50    Time  6    Period  Weeks    Status  New    Target Date  04/26/19            Plan - 04/20/19 0916    Clinical Impression Statement  Pt is about 8 wks s/p R dupuytrens release in palm and volar 5th digit - pt making great progress in scar healing , AROM - and extention of 5th PIP -15 - fabricate this date PIP extention gutter splint for night time use with coban wrap to decrease edema - and cont to focus on scar massage mostly on volar 5th now    OT Occupational Profile and History  Problem Focused Assessment - Including review of records relating to presenting problem    Occupational performance deficits (Please refer to evaluation for details):  ADL's;IADL's;Work;Play;Leisure;Social Participation    Body Structure / Function / Physical Skills  ADL;ROM;UE functional use;Scar mobility;Skin integrity;Strength;Pain;IADL;Edema    Rehab Potential  Good    Clinical Decision Making  Limited treatment options, no task modification necessary    Comorbidities Affecting Occupational Performance:  None    Modification or Assistance to Complete Evaluation   No modification of tasks or assist necessary to complete eval    OT Frequency  Biweekly    OT Duration  2 weeks    OT Treatment/Interventions  Self-care/ADL training;Therapeutic exercise;Patient/family education;Scar mobilization;Manual Therapy;Passive range of motion;Contrast Bath;Paraffin;Fluidtherapy    Plan  assess extention of PIP 5th - PROM and rolling putty- splint wearing  OT Home Exercise Plan  see pt instruction    Consulted and Agree with Plan of Care  Patient       Patient will benefit from skilled  therapeutic intervention in order to improve the following deficits and impairments:   Body Structure / Function / Physical Skills: ADL, ROM, UE functional use, Scar mobility, Skin integrity, Strength, Pain, IADL, Edema       Visit Diagnosis: Pain in right hand  Muscle weakness (generalized)  Scar condition and fibrosis of skin  Stiffness of right hand, not elsewhere classified    Problem List There are no problems to display for this patient.   Rosalyn Gess 04/20/2019, 1:07 PM  [facility] [address], [place], [address] Phone: [phone]   Fax:  [phone]  Name: KEANTE TROJANOWSKI MRN: [id number] Date of Birth: Jun 21, 1960

## 2019-04-20 NOTE — Patient Instructions (Signed)
Same -but add PIP extention gutter splint for night time use -with coban wrap

## 2019-04-20 NOTE — Therapy (Signed)
[facility] [address] [place], [address] Phone: [phone]   Fax:  [phone]  Occupational Therapy Treatment  Patient Details  Name: Joel Campos MRN: [id number] Date of Birth: [birth year] Referring Provider (OT): Dr Roland Rack   Encounter Date: 04/20/2019  OT End of Session - 04/20/19 0916    Visit Number  5    Number of Visits  12    Date for OT Re-Evaluation  04/26/19    OT Start Time  0832    OT Stop Time  0912    OT Time Calculation (min)  40 min    Activity Tolerance  Patient tolerated treatment well    Behavior During Therapy  [facility] for tasks assessed/performed       Past Medical History:  Diagnosis Date  . CVA (cerebral vascular accident) ([place])   . GERD (gastroesophageal reflux disease)   . History of multiple pulmonary nodules   . Hypertension   . Multiple gastric ulcers   . Pancreatitis     Past Surgical History:  Procedure Laterality Date  . COLONOSCOPY WITH PROPOFOL N/A 08/26/2017   Procedure: COLONOSCOPY WITH PROPOFOL;  Surgeon: Manya Silvas, MD;  Location: [facility] ENDOSCOPY;  Service: Endoscopy;  Laterality: N/A;  . DUPUYTREN CONTRACTURE RELEASE Right 02/21/2019   Procedure: DUPUYTREN CONTRACTURE RELEASE;  Surgeon: Corky Mull, MD;  Location: [facility] ORS;  Service: Orthopedics;  Laterality: Right;  . ESOPHAGOGASTRODUODENOSCOPY (EGD) WITH PROPOFOL N/A 08/26/2017   Procedure: ESOPHAGOGASTRODUODENOSCOPY (EGD) WITH PROPOFOL;  Surgeon: Manya Silvas, MD;  Location: [facility] ENDOSCOPY;  Service: Endoscopy;  Laterality: N/A;    There were no vitals filed for this visit.  Subjective Assessment - 04/20/19 0914    Subjective   Doing okay- the scar still on my pinkie is tender - but using it -and rolling the putty- pushing down behind that middle joint - did not do my exercises this morning yet    Pertinent History  Pt had dupuytrens release on 02/21/2019 by Dr Roland Rack - was in bandage and  stitches come out 03/05/2019 - refer to hand therapy/OT    Patient Stated Goals  Want to get my R hand healed, motion and strength  better and the pain so I can use it and go back to work    Currently in Pain?  Yes    Pain Score  2     Pain Location  Finger (Comment which one)    Pain Orientation  Right    Pain Descriptors / Indicators  Tender    Pain Type  Surgical pain    Pain Onset  More than a month ago    Pain Frequency  Intermittent         [facility] OT Assessment - 04/20/19 0001      Edema   Edema  PIP 5th 6.3 cm, L 5.5 cm       Strength   Right Hand Grip (lbs)  60   did not do HEP this am      Right Hand AROM   R Little  MCP 0-90  90 Degrees    R Little PIP 0-100  90 Degrees   -15 lateral side     Pt report did not do his HEP this am - stiff- but flexion same - lacking only last 10 degrees at PIP - but scar tissue still thick on volar 5th and edema increase .8cm at PIP compare to L hand  PIP extention -  15          OT Treatments/Exercises (OP) - 04/20/19 0001      RUE Paraffin   Number Minutes Paraffin  8 Minutes    RUE Paraffin Location  Hand    Comments  prior to ROM and splint fabirciation       scar massage done by OT and vibration used - tolerate well- scar softer and increase flexibility at PIP  Less tender over volar 5th digit this date  And can do some massage for edema   Pt to do PIP stretch with pressure behind PIP on table - 5 x 30 sec - better success with that than LMB splint  AROM into table for PIP extention tendon glidesfor flexion still - but focus on PIP extention Edemain 5th still increase- and fitted with new silicon compression sleeveson 5th  And fabricated extention splint for PIP of 5th and ed on using coban wrap to keep in place and for edema   Rolling putty - firm green for scar mobs and extention of 5th - but NO grippingstill Cont night time with cica scar pad( new on provided)         OT Education - 04/20/19  0916    Education Details  HEP and splint wearing    Person(s) Educated  Patient    Methods  Explanation;Demonstration;Tactile cues;Verbal cues;Handout    Comprehension  Verbal cues required;Returned demonstration;Verbalized understanding       OT Short Term Goals - 03/15/19 1706      OT SHORT TERM GOAL #1   Title  Pt to be independent in decrease pain on PRWHE by 20 points to increase AROM    Baseline  PRWHE pain at eval 32/50    Time  3    Period  Weeks    Status  New    Target Date  04/05/19        OT Long Term Goals - 03/15/19 1707      OT LONG TERM GOAL #1   Title  AROM for R 5th digit extention improve with 15 degrees without pain    Baseline  Pt was doing PROM and scab open and painfull at volar PIP - decrease extention at PIP - see flowsheet    Time  3    Period  Weeks    Status  New    Target Date  04/05/19      OT LONG TERM GOAL #2   Title  scar tissue improve for pt to tolerate different texture and to touch palm to be able to hold tools    Baseline  decrase flexion - see flowsheet - and tender and pain over scars - 6/10    Time  6    Period  Weeks    Status  New    Target Date  04/26/19      OT LONG TERM GOAL #3   Title  function on PRWHE improve wiht more than 20 points    Baseline  PRHWE function score at eval 22/50    Time  6    Period  Weeks    Status  New    Target Date  04/26/19            Plan - 04/20/19 0916    Clinical Impression Statement  Pt is about 8 wks s/p R dupuytrens release in palm and volar 5th digit - pt making great progress in scar healing , AROM - and extention of 5th  PIP -15 - fabricate this date PIP extention gutter splint for night time use with coban wrap to decrease edema - and cont to focus on scar massage mostly on volar 5th now    OT Occupational Profile and History  Problem Focused Assessment - Including review of records relating to presenting problem    Occupational performance deficits (Please refer to evaluation  for details):  ADL's;IADL's;Work;Play;Leisure;Social Participation    Body Structure / Function / Physical Skills  ADL;ROM;UE functional use;Scar mobility;Skin integrity;Strength;Pain;IADL;Edema    Rehab Potential  Good    Clinical Decision Making  Limited treatment options, no task modification necessary    Comorbidities Affecting Occupational Performance:  None    Modification or Assistance to Complete Evaluation   No modification of tasks or assist necessary to complete eval    OT Frequency  Biweekly    OT Duration  2 weeks    OT Treatment/Interventions  Self-care/ADL training;Therapeutic exercise;Patient/family education;Scar mobilization;Manual Therapy;Passive range of motion;Contrast Bath;Paraffin;Fluidtherapy    Plan  assess extention of PIP 5th - PROM and rolling putty- splint wearing    OT Home Exercise Plan  see pt instruction    Consulted and Agree with Plan of Care  Patient       Patient will benefit from skilled therapeutic intervention in order to improve the following deficits and impairments:   Body Structure / Function / Physical Skills: ADL, ROM, UE functional use, Scar mobility, Skin integrity, Strength, Pain, IADL, Edema       Visit Diagnosis: Pain in right hand  Muscle weakness (generalized)  Scar condition and fibrosis of skin  Stiffness of right hand, not elsewhere classified    Problem List There are no problems to display for this patient.   Rosalyn Gess OTR/L,CLT 04/20/2019, 1:07 PM  [facility] [address] [place], [address] Phone: [phone]   Fax:  [phone]  Name: Joel Campos MRN: [id number] Date of Birth: [birth year]

## 2019-05-01 ENCOUNTER — Ambulatory Visit: Payer: BC Managed Care – PPO | Admitting: Occupational Therapy

## 2019-05-01 ENCOUNTER — Other Ambulatory Visit: Payer: Self-pay

## 2019-05-01 DIAGNOSIS — L905 Scar conditions and fibrosis of skin: Secondary | ICD-10-CM

## 2019-05-01 DIAGNOSIS — M79641 Pain in right hand: Secondary | ICD-10-CM

## 2019-05-01 DIAGNOSIS — M6281 Muscle weakness (generalized): Secondary | ICD-10-CM

## 2019-05-01 DIAGNOSIS — M25641 Stiffness of right hand, not elsewhere classified: Secondary | ICD-10-CM

## 2019-05-01 NOTE — Therapy (Signed)
[facility] [address], [place], [address] Phone: [phone]   Fax:  [phone]  Occupational Therapy Treatment  Patient Details  Name: Joel Campos MRN: [id number] Date of Birth: 1960/12/03 Referring Provider (OT): Dr Roland Rack   Encounter Date: 05/01/2019  OT End of Session - 05/01/19 0823    Visit Number  6    Number of Visits  12    Date for OT Re-Evaluation  04/26/19    OT Start Time  0818    OT Stop Time  0841    OT Time Calculation (min)  23 min    Activity Tolerance  Patient tolerated treatment well    Behavior During Therapy  [facility] for tasks assessed/performed       Past Medical History:  Diagnosis Date  . CVA (cerebral vascular accident) ([place])   . GERD (gastroesophageal reflux disease)   . History of multiple pulmonary nodules   . Hypertension   . Multiple gastric ulcers   . Pancreatitis     Past Surgical History:  Procedure Laterality Date  . COLONOSCOPY WITH PROPOFOL N/A 08/26/2017   Procedure: COLONOSCOPY WITH PROPOFOL;  Surgeon: Manya Silvas, MD;  Location: [facility] ENDOSCOPY;  Service: Endoscopy;  Laterality: N/A;  . DUPUYTREN CONTRACTURE RELEASE Right 02/21/2019   Procedure: DUPUYTREN CONTRACTURE RELEASE;  Surgeon: Corky Mull, MD;  Location: [facility] ORS;  Service: Orthopedics;  Laterality: Right;  . ESOPHAGOGASTRODUODENOSCOPY (EGD) WITH PROPOFOL N/A 08/26/2017   Procedure: ESOPHAGOGASTRODUODENOSCOPY (EGD) WITH PROPOFOL;  Surgeon: Manya Silvas, MD;  Location: [facility] ENDOSCOPY;  Service: Endoscopy;  Laterality: N/A;    There were no vitals filed for this visit.  Subjective Assessment - 05/01/19 0821    Subjective   Using it more - tenderness getting better- using splint in the night - stiff in the am but loosening up - getting stronger too    Pertinent History  Pt had dupuytrens release on 02/21/2019 by Dr Roland Rack - was in bandage and stitches come out 03/05/2019 - refer to hand  therapy/OT    Patient Stated Goals  Want to get my R hand healed, motion and strength  better and the pain so I can use it and go back to work    Currently in Pain?  Yes    Pain Score  2     Pain Location  Hand    Pain Descriptors / Indicators  Tender    Pain Type  Surgical pain    Pain Onset  More than a month ago    Pain Frequency  Intermittent         [facility] OT Assessment - 05/01/19 0001      Edema   Edema  PIP 5th 6.2 cm R , L 5.5 cm       Strength   Right Hand Grip (lbs)  67    Left Hand Grip (lbs)  79      Right Hand AROM   R Little  MCP 0-90  90 Degrees    R Little PIP 0-100  90 Degrees   -10     cont to have  Edema in R 5th digit - but fibrosis and scar tissue improved - no mostly on volar PIP  To focus on scar massage , and for edema  And wear compression  Sleeve and scar pad - as well as PIP extention splint night time with coban          OT  Treatments/Exercises (OP) - 05/01/19 0001      RUE Paraffin   Number Minutes Paraffin  8 Minutes    RUE Paraffin Location  Hand    Comments  LMB PIP extention splint on - prior to ROM and soft tissue        scar massage done by OT and vibration used - tolerate well- scar softer and increase flexibility at PIP  Less tender over volar 5th digitthis dateagain Reinforce for pt to do some  massage for edema   Pt to do PIP stretch with pressure behind PIP on table - 5 x 30 sec AROM into table for PIP extention tendon glidesfor flexion still - but focus on PIP extention Edemain 5th still increase 0.7 cm- cont with silicon compression sleeveson 5th  Cont with wearing  extention splint for PIP of 5th and ed on using coban wrap to keep in place and for edema   Rolling putty - firm green for scar mobs and extention of 5th - but NO grippingstill Cont night time with cica scar pad       OT Education - 05/01/19 0823    Education Details  progress and HEP    Person(s) Educated  Patient    Methods   Explanation;Demonstration;Tactile cues;Verbal cues;Handout    Comprehension  Verbal cues required;Returned demonstration;Verbalized understanding       OT Short Term Goals - 03/15/19 1706      OT SHORT TERM GOAL #1   Title  Pt to be independent in decrease pain on PRWHE by 20 points to increase AROM    Baseline  PRWHE pain at eval 32/50    Time  3    Period  Weeks    Status  New    Target Date  04/05/19        OT Long Term Goals - 03/15/19 1707      OT LONG TERM GOAL #1   Title  AROM for R 5th digit extention improve with 15 degrees without pain    Baseline  Pt was doing PROM and scab open and painfull at volar PIP - decrease extention at PIP - see flowsheet    Time  3    Period  Weeks    Status  New    Target Date  04/05/19      OT LONG TERM GOAL #2   Title  scar tissue improve for pt to tolerate different texture and to touch palm to be able to hold tools    Baseline  decrase flexion - see flowsheet - and tender and pain over scars - 6/10    Time  6    Period  Weeks    Status  New    Target Date  04/26/19      OT LONG TERM GOAL #3   Title  function on PRWHE improve wiht more than 20 points    Baseline  PRHWE function score at eval 22/50    Time  6    Period  Weeks    Status  New    Target Date  04/26/19            Plan - 05/01/19 0824    Clinical Impression Statement  Pt is about 10 wks s/p R dupuytrents release in palm and volar 5th digit - pt making great progress in scar healing , grip strength and AROM - pt -10 at PIP this date for extentoin - cont to have 0.7 edema in PIP -  and scar tissue and fibrosis decrease compare to last time - but still to focus on scar tissue on volar PIP - cont compression on 5th digit    OT Occupational Profile and History  Problem Focused Assessment - Including review of records relating to presenting problem    Occupational performance deficits (Please refer to evaluation for details):  ADL's;IADL's;Work;Play;Leisure;Social  Participation    Body Structure / Function / Physical Skills  ADL;ROM;UE functional use;Scar mobility;Skin integrity;Strength;Pain;IADL;Edema    Rehab Potential  Good    Clinical Decision Making  Limited treatment options, no task modification necessary    Comorbidities Affecting Occupational Performance:  None    Modification or Assistance to Complete Evaluation   No modification of tasks or assist necessary to complete eval    OT Frequency  Biweekly    OT Duration  2 weeks    OT Treatment/Interventions  Self-care/ADL training;Therapeutic exercise;Patient/family education;Scar mobilization;Manual Therapy;Passive range of motion;Contrast Bath;Paraffin;Fluidtherapy    Plan  assess extention of PIP 5th - PROM and rolling putty- splint wearing    OT Home Exercise Plan  see pt instruction    Consulted and Agree with Plan of Care  Patient       Patient will benefit from skilled therapeutic intervention in order to improve the following deficits and impairments:   Body Structure / Function / Physical Skills: ADL, ROM, UE functional use, Scar mobility, Skin integrity, Strength, Pain, IADL, Edema       Visit Diagnosis: Pain in right hand  Muscle weakness (generalized)  Scar condition and fibrosis of skin  Stiffness of right hand, not elsewhere classified    Problem List There are no problems to display for this patient.   Rosalyn Gess OTR/L,CLT 05/01/2019, 8:56 AM  [facility] [address], [place], [address] Phone: [phone]   Fax:  [phone]  Name: Joel Campos MRN: [id number] Date of Birth: 10/24/1960

## 2019-05-01 NOTE — Patient Instructions (Signed)
Same HEP  

## 2019-05-15 ENCOUNTER — Ambulatory Visit: Payer: BC Managed Care – PPO | Attending: Student | Admitting: Occupational Therapy

## 2019-05-15 ENCOUNTER — Other Ambulatory Visit: Payer: Self-pay

## 2019-05-15 DIAGNOSIS — M25641 Stiffness of right hand, not elsewhere classified: Secondary | ICD-10-CM | POA: Diagnosis present

## 2019-05-15 DIAGNOSIS — L905 Scar conditions and fibrosis of skin: Secondary | ICD-10-CM | POA: Insufficient documentation

## 2019-05-15 DIAGNOSIS — M79641 Pain in right hand: Secondary | ICD-10-CM | POA: Diagnosis present

## 2019-05-15 DIAGNOSIS — M6281 Muscle weakness (generalized): Secondary | ICD-10-CM | POA: Insufficient documentation

## 2019-05-15 NOTE — Therapy (Signed)
[place] [facility] [address], [place], [address] Phone: [phone]   Fax:  [phone]  Occupational Therapy Treatment/discharge  Patient Details  Name: Joel Campos MRN: [id number] Date of Birth: 1960-11-22 Referring Provider (OT): Dr Roland Rack   Encounter Date: 05/15/2019  OT End of Session - 05/15/19 0950    Visit Number  7    Number of Visits  12    Date for OT Re-Evaluation  04/26/19    OT Start Time  0814    OT Stop Time  0852    OT Time Calculation (min)  38 min    Activity Tolerance  Patient tolerated treatment well    Behavior During Therapy  [facility] for tasks assessed/performed       Past Medical History:  Diagnosis Date  . CVA (cerebral vascular accident) ([place])   . GERD (gastroesophageal reflux disease)   . History of multiple pulmonary nodules   . Hypertension   . Multiple gastric ulcers   . Pancreatitis     Past Surgical History:  Procedure Laterality Date  . COLONOSCOPY WITH PROPOFOL N/A 08/26/2017   Procedure: COLONOSCOPY WITH PROPOFOL;  Surgeon: Manya Silvas, MD;  Location: [facility] ENDOSCOPY;  Service: Endoscopy;  Laterality: N/A;  . DUPUYTREN CONTRACTURE RELEASE Right 02/21/2019   Procedure: DUPUYTREN CONTRACTURE RELEASE;  Surgeon: Corky Mull, MD;  Location: [facility] ORS;  Service: Orthopedics;  Laterality: Right;  . ESOPHAGOGASTRODUODENOSCOPY (EGD) WITH PROPOFOL N/A 08/26/2017   Procedure: ESOPHAGOGASTRODUODENOSCOPY (EGD) WITH PROPOFOL;  Surgeon: Manya Silvas, MD;  Location: [facility] ENDOSCOPY;  Service: Endoscopy;  Laterality: N/A;    There were no vitals filed for this visit.  Subjective Assessment - 05/15/19 0946    Subjective   Doing most everything - still little tender here at the one place on pinkie- but more straight - still wearing splint at night time    Pertinent History  Pt had dupuytrens release on 02/21/2019 by Dr Roland Rack - was in bandage and stitches come out 03/05/2019 -  refer to hand therapy/OT    Patient Stated Goals  Want to get my R hand healed, motion and strength  better and the pain so I can use it and go back to work    Currently in Pain?  Yes    Pain Location  Finger (Comment which one)    Pain Orientation  Right    Pain Descriptors / Indicators  Tender    Pain Type  Surgical pain    Pain Onset  More than a month ago    Pain Frequency  Intermittent         [facility] OT Assessment - 05/15/19 0001      Strength   Right Hand Grip (lbs)  76    Right Hand Lateral Pinch  20 lbs    Right Hand 3 Point Pinch  20 lbs    Left Hand Grip (lbs)  79    Left Hand Lateral Pinch  21 lbs    Left Hand 3 Point Pinch  21 lbs      Right Hand AROM   R Little  MCP 0-90  90 Degrees    R Little PIP 0-100  90 Degrees   -10     Pt made [facility] from [facility] in AROM and strength - flexion WNL , extention at PIP of 5th -10 but pt to cont wearing night extention splint on and off as needed for 2 months and cont  scar massage  Still little tender at volar PIP and DIP - and skin dry - pt to use vit E or cocoa butter -  Can also get paraffin bath to use when going back to work           OT Treatments/Exercises (OP) - 05/15/19 0001      RUE Paraffin   Number Minutes Paraffin  8 Minutes    RUE Paraffin Location  Hand    Comments  LMB splint on PIP extention of 5th prior to soft tissue        scar massage done by OT and vibration used - tolerate well- scar softer and increase flexibility at PIP and DIP - less fibrosis at middle phalanges  Less tender over volar 5th digitthis dateagain Reinforce for pt to do some  massage for edemaand use vit C or cocoa butter  Pt to do PIP stretch with pressure behind PIP on table - 5 x 30 sec AROM into table for PIP extention tendon glidesfor flexion still - but focus on PIP extention Edemain 5th still increase 0.5 cm- cont with silicon compression sleeveson 5th Rolling putty - firm green for scar mobs and  extention of 5th  Cont night time with cica scar pad       OT Education - 05/15/19 0949    Education Details  discharge instructions    Person(s) Educated  Patient    Methods  Explanation;Demonstration;Tactile cues;Verbal cues;Handout    Comprehension  Verbal cues required;Returned demonstration;Verbalized understanding       OT Short Term Goals - 03/15/19 1706      OT SHORT TERM GOAL #1   Title  Pt to be independent in decrease pain on PRWHE by 20 points to increase AROM    Baseline  PRWHE pain at eval 32/50    Time  3    Period  Weeks    Status  New    Target Date  04/05/19        OT Long Term Goals - 03/15/19 1707      OT LONG TERM GOAL #1   Title  AROM for R 5th digit extention improve with 15 degrees without pain    Baseline  Pt was doing PROM and scab open and painfull at volar PIP - decrease extention at PIP - see flowsheet    Time  3    Period  Weeks    Status  New    Target Date  04/05/19      OT LONG TERM GOAL #2   Title  scar tissue improve for pt to tolerate different texture and to touch palm to be able to hold tools    Baseline  decrase flexion - see flowsheet - and tender and pain over scars - 6/10    Time  6    Period  Weeks    Status  New    Target Date  04/26/19      OT LONG TERM GOAL #3   Title  function on PRWHE improve wiht more than 20 points    Baseline  PRHWE function score at eval 22/50    Time  6    Period  Weeks    Status  New    Target Date  04/26/19            Plan - 05/15/19 0950    Clinical Impression Statement  Pt is about 12 wks s/p R dupuytrents release in palm and volar 5th digit-  volar PIP and DIP little dry skin and tender per pt - pt to use some Vit E and Cocoa butter - but AROM for flexion WNL , grip and prehension WNL - extention at PIP -10 - pt to use night splint on and off for another 2 months as needed when going back to work and cont scar massage another 2 months- pt met all goals and discharge at this time     OT Occupational Profile and History  Problem Focused Assessment - Including review of records relating to presenting problem    Occupational performance deficits (Please refer to evaluation for details):  ADL's;IADL's;Work;Play;Leisure;Social Participation    Body Structure / Function / Physical Skills  ADL;ROM;UE functional use;Scar mobility;Skin integrity;Strength;Pain;IADL;Edema    Rehab Potential  Good    Clinical Decision Making  Limited treatment options, no task modification necessary    Comorbidities Affecting Occupational Performance:  None    Modification or Assistance to Complete Evaluation   No modification of tasks or assist necessary to complete eval    OT Treatment/Interventions  Self-care/ADL training;Therapeutic exercise;Patient/family education;Scar mobilization;Manual Therapy;Passive range of motion;Contrast Bath;Paraffin;Fluidtherapy    Plan  discharge with homeprogram    OT Home Exercise Plan  see pt instruction    Consulted and Agree with Plan of Care  Patient       Patient will benefit from skilled therapeutic intervention in order to improve the following deficits and impairments:   Body Structure / Function / Physical Skills: ADL, ROM, UE functional use, Scar mobility, Skin integrity, Strength, Pain, IADL, Edema       Visit Diagnosis: Pain in right hand  Muscle weakness (generalized)  Scar condition and fibrosis of skin  Stiffness of right hand, not elsewhere classified    Problem List There are no problems to display for this patient.   Rosalyn Gess OTR/l,CLT 05/15/2019, 9:55 AM  [place] [facility] [address] [place], [address] Phone: [phone]   Fax:  [phone]  Name: SHAQUIL ALDANA MRN: [id number] Date of Birth: 16-Sep-1960

## 2019-05-15 NOTE — Patient Instructions (Signed)
Cont scar massage and AROM tendon glides   recommend using some cocoa butter or Vit E - or paraffin bath when going back to work  moisturize finger and joint - to help for tenderness  And use extention splint night time until back to work and then maybe for another 2 month on weekend
